# Patient Record
Sex: Female | Born: 1965 | Race: Black or African American | Hispanic: No | Marital: Single | Smoking: Current every day smoker
Health system: Southern US, Community
[De-identification: ages and names within clinical notes are randomized; demographics above are authoritative.]

## PROBLEM LIST (undated history)

## (undated) DIAGNOSIS — F329 Major depressive disorder, single episode, unspecified: Secondary | ICD-10-CM

## (undated) DIAGNOSIS — D219 Benign neoplasm of connective and other soft tissue, unspecified: Secondary | ICD-10-CM

## (undated) DIAGNOSIS — F32A Depression, unspecified: Secondary | ICD-10-CM

## (undated) DIAGNOSIS — I10 Essential (primary) hypertension: Secondary | ICD-10-CM

## (undated) HISTORY — DX: Essential (primary) hypertension: I10

## (undated) HISTORY — PX: ANKLE SURGERY: SHX546

## (undated) HISTORY — DX: Depression, unspecified: F32.A

## (undated) HISTORY — DX: Major depressive disorder, single episode, unspecified: F32.9

## (undated) HISTORY — PX: OTHER SURGICAL HISTORY: SHX169

## (undated) HISTORY — DX: Benign neoplasm of connective and other soft tissue, unspecified: D21.9

---

## 1998-01-10 ENCOUNTER — Emergency Department (HOSPITAL_COMMUNITY): Admission: EM | Admit: 1998-01-10 | Discharge: 1998-01-10 | Payer: Self-pay | Admitting: Emergency Medicine

## 1998-11-24 ENCOUNTER — Emergency Department (HOSPITAL_COMMUNITY): Admission: EM | Admit: 1998-11-24 | Discharge: 1998-11-24 | Payer: Self-pay | Admitting: Emergency Medicine

## 1998-11-24 ENCOUNTER — Encounter: Payer: Self-pay | Admitting: Emergency Medicine

## 1999-02-07 ENCOUNTER — Emergency Department (HOSPITAL_COMMUNITY): Admission: EM | Admit: 1999-02-07 | Discharge: 1999-02-07 | Payer: Self-pay | Admitting: Emergency Medicine

## 1999-02-07 ENCOUNTER — Encounter: Payer: Self-pay | Admitting: Emergency Medicine

## 1999-03-11 ENCOUNTER — Emergency Department (HOSPITAL_COMMUNITY): Admission: EM | Admit: 1999-03-11 | Discharge: 1999-03-11 | Payer: Self-pay | Admitting: Emergency Medicine

## 2001-02-07 ENCOUNTER — Emergency Department (HOSPITAL_COMMUNITY): Admission: EM | Admit: 2001-02-07 | Discharge: 2001-02-07 | Payer: Self-pay | Admitting: Emergency Medicine

## 2001-04-30 ENCOUNTER — Emergency Department (HOSPITAL_COMMUNITY): Admission: EM | Admit: 2001-04-30 | Discharge: 2001-04-30 | Payer: Self-pay | Admitting: Emergency Medicine

## 2002-10-12 ENCOUNTER — Emergency Department (HOSPITAL_COMMUNITY): Admission: EM | Admit: 2002-10-12 | Discharge: 2002-10-13 | Payer: Self-pay | Admitting: Emergency Medicine

## 2002-12-23 ENCOUNTER — Encounter: Payer: Self-pay | Admitting: Internal Medicine

## 2002-12-23 ENCOUNTER — Ambulatory Visit (HOSPITAL_COMMUNITY): Admission: RE | Admit: 2002-12-23 | Discharge: 2002-12-23 | Payer: Self-pay | Admitting: Internal Medicine

## 2003-03-23 ENCOUNTER — Ambulatory Visit (HOSPITAL_COMMUNITY): Admission: RE | Admit: 2003-03-23 | Discharge: 2003-03-23 | Payer: Self-pay | Admitting: Internal Medicine

## 2003-08-05 ENCOUNTER — Emergency Department (HOSPITAL_COMMUNITY): Admission: EM | Admit: 2003-08-05 | Discharge: 2003-08-05 | Payer: Self-pay | Admitting: Emergency Medicine

## 2003-11-27 ENCOUNTER — Emergency Department (HOSPITAL_COMMUNITY): Admission: EM | Admit: 2003-11-27 | Discharge: 2003-11-27 | Payer: Self-pay | Admitting: *Deleted

## 2004-01-09 ENCOUNTER — Emergency Department (HOSPITAL_COMMUNITY): Admission: EM | Admit: 2004-01-09 | Discharge: 2004-01-10 | Payer: Self-pay | Admitting: Emergency Medicine

## 2004-02-09 ENCOUNTER — Emergency Department (HOSPITAL_COMMUNITY): Admission: EM | Admit: 2004-02-09 | Discharge: 2004-02-09 | Payer: Self-pay | Admitting: *Deleted

## 2004-05-02 ENCOUNTER — Emergency Department (HOSPITAL_COMMUNITY): Admission: EM | Admit: 2004-05-02 | Discharge: 2004-05-02 | Payer: Self-pay | Admitting: Emergency Medicine

## 2004-05-13 ENCOUNTER — Emergency Department (HOSPITAL_COMMUNITY): Admission: EM | Admit: 2004-05-13 | Discharge: 2004-05-13 | Payer: Self-pay | Admitting: Family Medicine

## 2004-05-15 ENCOUNTER — Emergency Department (HOSPITAL_COMMUNITY): Admission: EM | Admit: 2004-05-15 | Discharge: 2004-05-15 | Payer: Self-pay | Admitting: Family Medicine

## 2004-05-17 ENCOUNTER — Emergency Department (HOSPITAL_COMMUNITY): Admission: EM | Admit: 2004-05-17 | Discharge: 2004-05-17 | Payer: Self-pay | Admitting: Family Medicine

## 2004-11-07 ENCOUNTER — Emergency Department (HOSPITAL_COMMUNITY): Admission: EM | Admit: 2004-11-07 | Discharge: 2004-11-07 | Payer: Self-pay | Admitting: Family Medicine

## 2005-03-10 ENCOUNTER — Emergency Department (HOSPITAL_COMMUNITY): Admission: EM | Admit: 2005-03-10 | Discharge: 2005-03-11 | Payer: Self-pay | Admitting: Emergency Medicine

## 2005-03-15 ENCOUNTER — Emergency Department (HOSPITAL_COMMUNITY): Admission: EM | Admit: 2005-03-15 | Discharge: 2005-03-15 | Payer: Self-pay | Admitting: Emergency Medicine

## 2006-02-21 ENCOUNTER — Emergency Department (HOSPITAL_COMMUNITY): Admission: EM | Admit: 2006-02-21 | Discharge: 2006-02-21 | Payer: Self-pay | Admitting: Family Medicine

## 2006-02-22 ENCOUNTER — Ambulatory Visit (HOSPITAL_COMMUNITY): Admission: RE | Admit: 2006-02-22 | Discharge: 2006-02-22 | Payer: Self-pay | Admitting: Emergency Medicine

## 2006-04-08 ENCOUNTER — Ambulatory Visit: Payer: Self-pay | Admitting: Family Medicine

## 2006-04-09 ENCOUNTER — Ambulatory Visit: Payer: Self-pay | Admitting: *Deleted

## 2006-08-29 ENCOUNTER — Emergency Department (HOSPITAL_COMMUNITY): Admission: EM | Admit: 2006-08-29 | Discharge: 2006-08-30 | Payer: Self-pay | Admitting: Emergency Medicine

## 2006-09-02 ENCOUNTER — Emergency Department (HOSPITAL_COMMUNITY): Admission: EM | Admit: 2006-09-02 | Discharge: 2006-09-02 | Payer: Self-pay | Admitting: Family Medicine

## 2006-10-07 ENCOUNTER — Ambulatory Visit: Payer: Self-pay | Admitting: Family Medicine

## 2006-11-01 ENCOUNTER — Ambulatory Visit: Payer: Self-pay | Admitting: Family Medicine

## 2007-03-26 ENCOUNTER — Encounter (INDEPENDENT_AMBULATORY_CARE_PROVIDER_SITE_OTHER): Payer: Self-pay | Admitting: *Deleted

## 2007-05-06 ENCOUNTER — Encounter (INDEPENDENT_AMBULATORY_CARE_PROVIDER_SITE_OTHER): Payer: Self-pay | Admitting: Family Medicine

## 2007-05-06 ENCOUNTER — Ambulatory Visit: Payer: Self-pay | Admitting: Family Medicine

## 2007-05-06 LAB — CONVERTED CEMR LAB: GC Probe Amp, Genital: NEGATIVE

## 2007-05-09 ENCOUNTER — Ambulatory Visit: Payer: Self-pay | Admitting: Internal Medicine

## 2007-06-01 ENCOUNTER — Emergency Department (HOSPITAL_COMMUNITY): Admission: EM | Admit: 2007-06-01 | Discharge: 2007-06-01 | Payer: Self-pay | Admitting: Emergency Medicine

## 2007-06-11 ENCOUNTER — Ambulatory Visit: Payer: Self-pay | Admitting: Family Medicine

## 2007-10-28 ENCOUNTER — Ambulatory Visit: Payer: Self-pay | Admitting: Internal Medicine

## 2007-12-10 ENCOUNTER — Emergency Department (HOSPITAL_COMMUNITY): Admission: EM | Admit: 2007-12-10 | Discharge: 2007-12-10 | Payer: Self-pay | Admitting: Family Medicine

## 2008-03-24 ENCOUNTER — Ambulatory Visit: Payer: Self-pay | Admitting: Family Medicine

## 2008-04-28 ENCOUNTER — Emergency Department (HOSPITAL_COMMUNITY): Admission: EM | Admit: 2008-04-28 | Discharge: 2008-04-28 | Payer: Self-pay | Admitting: Family Medicine

## 2008-05-04 ENCOUNTER — Ambulatory Visit: Payer: Self-pay | Admitting: Family Medicine

## 2008-09-02 ENCOUNTER — Ambulatory Visit: Payer: Self-pay | Admitting: Family Medicine

## 2008-09-06 ENCOUNTER — Ambulatory Visit (HOSPITAL_COMMUNITY): Admission: RE | Admit: 2008-09-06 | Discharge: 2008-09-06 | Payer: Self-pay | Admitting: Internal Medicine

## 2008-09-30 ENCOUNTER — Ambulatory Visit: Payer: Self-pay | Admitting: Family Medicine

## 2008-10-02 ENCOUNTER — Inpatient Hospital Stay (HOSPITAL_COMMUNITY): Admission: AD | Admit: 2008-10-02 | Discharge: 2008-10-02 | Payer: Self-pay | Admitting: Family Medicine

## 2008-10-03 ENCOUNTER — Inpatient Hospital Stay (HOSPITAL_COMMUNITY): Admission: AD | Admit: 2008-10-03 | Discharge: 2008-10-03 | Payer: Self-pay | Admitting: Obstetrics and Gynecology

## 2008-10-19 ENCOUNTER — Ambulatory Visit: Payer: Self-pay | Admitting: Family Medicine

## 2008-10-20 ENCOUNTER — Inpatient Hospital Stay (HOSPITAL_COMMUNITY): Admission: AD | Admit: 2008-10-20 | Discharge: 2008-10-20 | Payer: Self-pay | Admitting: Obstetrics & Gynecology

## 2008-10-27 ENCOUNTER — Ambulatory Visit: Payer: Self-pay | Admitting: Obstetrics & Gynecology

## 2008-11-01 ENCOUNTER — Emergency Department (HOSPITAL_COMMUNITY): Admission: EM | Admit: 2008-11-01 | Discharge: 2008-11-01 | Payer: Self-pay | Admitting: Emergency Medicine

## 2008-11-08 ENCOUNTER — Ambulatory Visit: Payer: Self-pay | Admitting: Obstetrics & Gynecology

## 2008-11-08 ENCOUNTER — Emergency Department (HOSPITAL_COMMUNITY): Admission: EM | Admit: 2008-11-08 | Discharge: 2008-11-08 | Payer: Self-pay | Admitting: Emergency Medicine

## 2008-12-30 ENCOUNTER — Ambulatory Visit: Payer: Self-pay | Admitting: Family Medicine

## 2009-01-24 ENCOUNTER — Ambulatory Visit: Payer: Self-pay | Admitting: Obstetrics & Gynecology

## 2009-02-16 ENCOUNTER — Ambulatory Visit: Payer: Self-pay | Admitting: Family Medicine

## 2009-02-16 LAB — CONVERTED CEMR LAB: Vit D, 25-Hydroxy: 18 ng/mL — ABNORMAL LOW (ref 30–89)

## 2009-03-08 ENCOUNTER — Emergency Department (HOSPITAL_COMMUNITY): Admission: EM | Admit: 2009-03-08 | Discharge: 2009-03-08 | Payer: Self-pay | Admitting: Emergency Medicine

## 2009-04-03 ENCOUNTER — Emergency Department (HOSPITAL_COMMUNITY): Admission: EM | Admit: 2009-04-03 | Discharge: 2009-04-04 | Payer: Self-pay | Admitting: Emergency Medicine

## 2009-04-04 ENCOUNTER — Ambulatory Visit: Payer: Self-pay | Admitting: Psychiatry

## 2009-04-14 ENCOUNTER — Ambulatory Visit: Payer: Self-pay | Admitting: Obstetrics and Gynecology

## 2009-05-12 ENCOUNTER — Telehealth (INDEPENDENT_AMBULATORY_CARE_PROVIDER_SITE_OTHER): Payer: Self-pay | Admitting: *Deleted

## 2009-06-17 ENCOUNTER — Telehealth (INDEPENDENT_AMBULATORY_CARE_PROVIDER_SITE_OTHER): Payer: Self-pay | Admitting: *Deleted

## 2009-06-30 ENCOUNTER — Ambulatory Visit: Payer: Self-pay | Admitting: Obstetrics & Gynecology

## 2009-08-04 ENCOUNTER — Ambulatory Visit: Payer: Self-pay | Admitting: Physician Assistant

## 2009-08-04 ENCOUNTER — Inpatient Hospital Stay (HOSPITAL_COMMUNITY): Admission: AD | Admit: 2009-08-04 | Discharge: 2009-08-04 | Payer: Self-pay | Admitting: Obstetrics & Gynecology

## 2009-08-05 ENCOUNTER — Ambulatory Visit: Payer: Self-pay | Admitting: Family Medicine

## 2009-08-12 ENCOUNTER — Ambulatory Visit: Payer: Self-pay | Admitting: Obstetrics & Gynecology

## 2009-08-12 LAB — CONVERTED CEMR LAB
Albumin: 3.5 g/dL (ref 3.5–5.2)
Alkaline Phosphatase: 68 units/L (ref 39–117)
BUN: 13 mg/dL (ref 6–23)
Barbiturate Quant, Ur: NEGATIVE
CO2: 25 meq/L (ref 19–32)
Chloride: 105 meq/L (ref 96–112)
Cocaine Metabolites: POSITIVE — AB
Creatinine, Ser: 0.67 mg/dL (ref 0.40–1.20)
Glucose, Bld: 88 mg/dL (ref 70–99)
Methadone: NEGATIVE
Potassium: 4.1 meq/L (ref 3.5–5.3)
Sodium: 142 meq/L (ref 135–145)
Total Bilirubin: 0.2 mg/dL — ABNORMAL LOW (ref 0.3–1.2)
Total Protein: 7.5 g/dL (ref 6.0–8.3)

## 2009-11-16 ENCOUNTER — Ambulatory Visit: Payer: Self-pay | Admitting: Obstetrics and Gynecology

## 2009-11-30 ENCOUNTER — Emergency Department (HOSPITAL_COMMUNITY): Admission: EM | Admit: 2009-11-30 | Discharge: 2009-11-30 | Payer: Self-pay | Admitting: Family Medicine

## 2009-12-01 ENCOUNTER — Encounter: Admission: RE | Admit: 2009-12-01 | Discharge: 2009-12-01 | Payer: Self-pay | Admitting: Internal Medicine

## 2009-12-20 ENCOUNTER — Encounter: Admission: RE | Admit: 2009-12-20 | Discharge: 2009-12-20 | Payer: Self-pay | Admitting: Internal Medicine

## 2010-01-30 ENCOUNTER — Ambulatory Visit: Payer: Self-pay | Admitting: Obstetrics and Gynecology

## 2010-01-30 LAB — CONVERTED CEMR LAB
HCT: 40.2 % (ref 36.0–46.0)
Hemoglobin: 13.3 g/dL (ref 12.0–15.0)
WBC: 7.3 10*3/uL (ref 4.0–10.5)

## 2010-01-31 ENCOUNTER — Encounter: Payer: Self-pay | Admitting: Obstetrics and Gynecology

## 2010-01-31 LAB — CONVERTED CEMR LAB
Chlamydia, DNA Probe: NEGATIVE
Trich, Wet Prep: NONE SEEN
WBC, Wet Prep HPF POC: NONE SEEN

## 2010-02-27 ENCOUNTER — Ambulatory Visit (HOSPITAL_COMMUNITY): Admission: RE | Admit: 2010-02-27 | Discharge: 2010-02-27 | Payer: Self-pay | Admitting: Orthopedic Surgery

## 2010-03-16 ENCOUNTER — Emergency Department (HOSPITAL_COMMUNITY): Admission: EM | Admit: 2010-03-16 | Discharge: 2010-03-16 | Payer: Self-pay | Admitting: Family Medicine

## 2010-03-22 ENCOUNTER — Ambulatory Visit (HOSPITAL_COMMUNITY): Admission: RE | Admit: 2010-03-22 | Discharge: 2010-03-23 | Payer: Self-pay | Admitting: Orthopedic Surgery

## 2010-03-29 ENCOUNTER — Encounter (INDEPENDENT_AMBULATORY_CARE_PROVIDER_SITE_OTHER): Payer: Self-pay | Admitting: Emergency Medicine

## 2010-03-29 ENCOUNTER — Inpatient Hospital Stay (HOSPITAL_COMMUNITY): Admission: EM | Admit: 2010-03-29 | Discharge: 2010-03-31 | Payer: Self-pay | Admitting: Emergency Medicine

## 2010-03-29 ENCOUNTER — Ambulatory Visit: Payer: Self-pay | Admitting: Cardiology

## 2010-03-31 ENCOUNTER — Encounter (INDEPENDENT_AMBULATORY_CARE_PROVIDER_SITE_OTHER): Payer: Self-pay | Admitting: Internal Medicine

## 2010-04-10 ENCOUNTER — Telehealth (INDEPENDENT_AMBULATORY_CARE_PROVIDER_SITE_OTHER): Payer: Self-pay | Admitting: *Deleted

## 2010-04-11 ENCOUNTER — Ambulatory Visit: Payer: Self-pay | Admitting: Cardiology

## 2010-04-11 ENCOUNTER — Encounter: Payer: Self-pay | Admitting: Cardiology

## 2010-04-11 ENCOUNTER — Encounter (INDEPENDENT_AMBULATORY_CARE_PROVIDER_SITE_OTHER): Payer: Self-pay | Admitting: *Deleted

## 2010-04-11 ENCOUNTER — Encounter: Payer: Self-pay | Admitting: Cardiovascular Disease

## 2010-04-11 ENCOUNTER — Encounter (HOSPITAL_COMMUNITY)
Admission: RE | Admit: 2010-04-11 | Discharge: 2010-04-24 | Payer: Self-pay | Source: Home / Self Care | Admitting: Cardiology

## 2010-04-11 ENCOUNTER — Ambulatory Visit: Payer: Self-pay

## 2010-04-17 ENCOUNTER — Ambulatory Visit: Payer: Self-pay | Admitting: Family Medicine

## 2010-04-20 ENCOUNTER — Emergency Department (HOSPITAL_COMMUNITY): Admission: EM | Admit: 2010-04-20 | Discharge: 2010-04-20 | Payer: Self-pay | Admitting: Family Medicine

## 2010-07-04 ENCOUNTER — Ambulatory Visit: Payer: Self-pay | Admitting: Family Medicine

## 2010-07-17 ENCOUNTER — Encounter (INDEPENDENT_AMBULATORY_CARE_PROVIDER_SITE_OTHER): Payer: Self-pay | Admitting: *Deleted

## 2010-07-17 ENCOUNTER — Ambulatory Visit
Admission: RE | Admit: 2010-07-17 | Discharge: 2010-07-17 | Payer: Self-pay | Source: Home / Self Care | Attending: Obstetrics and Gynecology | Admitting: Obstetrics and Gynecology

## 2010-07-17 LAB — CONVERTED CEMR LAB
Chlamydia, DNA Probe: NEGATIVE
GC Probe Amp, Genital: NEGATIVE

## 2010-07-18 ENCOUNTER — Encounter (INDEPENDENT_AMBULATORY_CARE_PROVIDER_SITE_OTHER): Payer: Self-pay | Admitting: *Deleted

## 2010-07-18 LAB — CONVERTED CEMR LAB
Trich, Wet Prep: NONE SEEN
WBC, Wet Prep HPF POC: NONE SEEN
Yeast Wet Prep HPF POC: NONE SEEN

## 2010-07-30 ENCOUNTER — Encounter: Payer: Self-pay | Admitting: Internal Medicine

## 2010-07-30 ENCOUNTER — Encounter: Payer: Self-pay | Admitting: Orthopedic Surgery

## 2010-08-02 ENCOUNTER — Ambulatory Visit (HOSPITAL_COMMUNITY)
Admission: RE | Admit: 2010-08-02 | Discharge: 2010-08-02 | Payer: Self-pay | Source: Home / Self Care | Attending: Obstetrics and Gynecology | Admitting: Obstetrics and Gynecology

## 2010-08-07 ENCOUNTER — Ambulatory Visit
Admission: RE | Admit: 2010-08-07 | Discharge: 2010-08-07 | Payer: Self-pay | Source: Home / Self Care | Attending: Obstetrics and Gynecology | Admitting: Obstetrics and Gynecology

## 2010-08-08 NOTE — Progress Notes (Signed)
NAME:  Kristin Bond, Kristin Bond NO.:  [id number]  MEDICAL RECORD NO.:  [id number]          PATIENT TYPE:  WOC  LOCATION:  [facility]                   FACILITY:  [facility]  PHYSICIAN:  Elsie Lincoln, MD      DATE OF BIRTH:  August 09, 1965  DATE OF SERVICE:  08/07/2010                                 CLINIC NOTE  The patient is a 45 year old female who presents for followup of cramping and bleeding.  The patient had a transvaginal ultrasound and saline sonohysterogram, which showed a fibroid again, but it does not invade the endometrial cavity.  The fibroid measured  4.2 x 3.9 x 3.6, which is slightly increased from the last ultrasound.  This is probably secondary to her not being under the influence of Depo-Lupron.  She also has a small her right lower uterine segment fibroid measuring 1.5 x 1.4 x 1.1.  Again, she does not have any submucosal component based on the saline sonohysterogram.  The patient does have a very thin lining less than 4 mm and this is probably due to Depo-Provera.  She has some irregular bleeding, but this has now seem to have improved.  She has not bled since the beginning of January.  There are multiple notes about the patient having all kinds of pelvic pain, pain in her anus, pain in her vulva, pain in her bladder, pain in her levators, pain in her abdomen. We have pretty much determined that a hysterectomy would not relieve the patient of her pain.  She again stated today that Depo-Lupron did not take her out of her pain, so I do not think this is endometriosis. There is some thoughts that she is drug seeking.  She has received Percocet from other doctors, and she told me that the pharmacist was checking around to see if she is getting prescriptions from other places.  I am going to ask, Branner, our nurse here in clinic to do some reconnaissance and see if she is getting multiple prescriptions for narcotics from multiple doctors.  The patient does have a  fibroid that is medium-sized, so this fibroid could be causing her pain.  However, I do not think she needs a tremendous amount of pain medication to control this, may be just some during her cycle.  I do not think she is a good surgical candidate.  I do think hysterectomy will help her with her multiple complaints.  I did notice today that the patient has not had a Pap smears since 2008.  I asked if we could do a Pap smear today and she refused.  I did get her to agree that we will do a Pap smear in April when she is due for her next Depo-Provera.  The patient is up-to-date on mammogram.  It should be noted today that her blood pressure was 147/102.  She states that she takes hydrochlorothiazide and we will contact her medical doctor about increasing her dosage for change in her medication.  So as we are leaving today, the patient is getting a prescription for Percocet and going to get Depo-Provera in April.  She is overall satisfied with her bleeding profile  and has slight pain during her "monthly."  She needs a Pap smear desperately and she has her blood pressure addressed.  The main thing that needs to be done is reconnaissance to see if she is getting narcotics from multiple doctors. I will ask Branner to see this.          ______________________________ Elsie Lincoln, MD    KL/MEDQ  D:  08/07/2010  T:  08/08/2010  Job:  [id number]

## 2010-08-10 NOTE — Progress Notes (Signed)
Summary: nuc pre procedure  Phone Note Outgoing Call Call back at Home Phone [phone]   Call placed by: Cathlyn Parsons RN,  April 10, 2010 2:40 PM Call placed to: Patient Reason for Call: Confirm/change Appt Summary of Call: Reviewed information on [employer] Information Sheet (see scanned document for further details).  Spoke with patient.      Nuclear Med Background Indications for Stress Test: Evaluation for Ischemia, Post Hospital  Indications Comments: 03/29/10 [facility] with CP and SOB;initial Troponins elevated then normalized.  History: Asthma, Echo  History Comments: 03/31/10 Echo-EF nl  Symptoms: Chest Pain, SOB    Nuclear Pre-Procedure Cardiac Risk Factors: Hypertension, Smoker

## 2010-08-10 NOTE — Letter (Signed)
Summary: Outpatient Coinsurance Notice  Outpatient Coinsurance Notice   Imported By: Marylou Mccoy 04/19/2010 12:01:19  _____________________________________________________________________  External Attachment:    Type:   Image     Comment:   External Document

## 2010-08-10 NOTE — Letter (Signed)
Summary: Outpatient Coinsurance Notice  Outpatient Coinsurance Notice   Imported By: Marylou Mccoy 04/19/2010 12:01:59  _____________________________________________________________________  External Attachment:    Type:   Image     Comment:   External Document

## 2010-08-10 NOTE — Assessment & Plan Note (Signed)
Summary: Cardiology Nuclear Testing  Nuclear Med Background Indications for Stress Test: Evaluation for Ischemia, Post Hospital  Indications Comments: 03/29/10 [facility] with CP and SOB; initial Troponins elevated then normalized.  History: Asthma, Echo  History Comments: 03/31/10 Echo:normal  Symptoms: Chest Pain, Diaphoresis, Dizziness, DOE, Fatigue, Rapid HR, SOB  Symptoms Comments: Last episode of JX:BJYN since d/c   Nuclear Pre-Procedure Cardiac Risk Factors: Hypertension, Obesity, Smoker Caffeine/Decaff Intake: None NPO After: 7:00 AM Lungs: Coarse, but clear.  O2 Sat 98% on RA. IV 0.9% NS with Angio Cath: 24g     IV Site: L Hand IV Started by: Irean Hong, RN Chest Size (in) 36     Cup Size D     Height (in): 61.5 Weight (lb): 180 BMI: 33.58  Nuclear Med Study 1 or 2 day study:  1 day     Stress Test Type:  Eugenie Birks Reading MD:  Marca Ancona, MD     Referring MD:  Willa Rough, MD Resting Radionuclide:  Technetium 19m Tetrofosmin     Resting Radionuclide Dose:  10.5 mCi  Stress Radionuclide:  Technetium 28m Tetrofosmin     Stress Radionuclide Dose:  33 mCi   Stress Protocol   Lexiscan: 0.4 mg   Stress Test Technologist:  [employer]a [employer], CMA-N     Nuclear Technologist:  Domenic Polite, CNMT  Rest Procedure  Myocardial perfusion imaging was performed at rest 45 minutes following the intravenous administration of Technetium 57m Tetrofosmin.  Stress Procedure  The patient received IV Lexiscan 0.4 mg over 15-seconds.  Technetium 83m Tetrofosmin injected at 30-seconds.  There were no significant changes with infusion.  Quantitative spect images were obtained after a 45 minute delay.  QPS Raw Data Images:  Normal; no motion artifact; normal heart/lung ratio. Stress Images:  Normal homogeneous uptake in all areas of the myocardium. Rest Images:  Normal homogeneous uptake in all areas of the myocardium. Subtraction (SDS):  Normal Transient Ischemic Dilatation:  0.94   (Normal <1.22)  Lung/Heart Ratio:  0.29  (Normal <0.45)  Quantitative Gated Spect Images QGS EDV:  57 ml QGS ESV:  21 ml QGS EF:  64 % QGS cine images:  Normal wall motion  Findings Normal nuclear study      Overall Impression  Exercise Capacity: Lexiscan with no exercise. BP Response: Normal blood pressure response. Clinical Symptoms: No chest pain ECG Impression: No significant ST segment change suggestive of ischemia. Overall Impression: Normal stress nuclear study.

## 2010-08-11 NOTE — Progress Notes (Signed)
NAME:  Kristin Bond, Kristin Bond NO.:  [id number]  MEDICAL RECORD NO.:  [id number]          PATIENT TYPE:  WOC  LOCATION:  [facility]                   FACILITY:  [facility]  PHYSICIAN:  Elsie Lincoln, MD      DATE OF BIRTH:  1966/06/21  DATE OF SERVICE:  08/10/2010                                 CLINIC NOTE  The patient was given a prescription for Percocet on August 07, 2010. The patient went to the pharmacy to get it filled and the pharmacy called and stated that the patient already had an active Percocet prescription from Dr. Lajoyce Corners.  She had been getting them for the past 3 months.  The patient did not tell me this when she was given the Percocet prescription.  The patient has gotten the Percocet prescription approximately around January 9 and it was 60 tablets.  I do think the patient is drug seeking.  I do not know whether she is selling or taking herself.  The patient has some reason for pain with the fibroids; however, she does have a lot of other pelvic issues for pain and her varying answers with satisfaction with pain is also of some concern. She also requested Perc 5, which also raises the thought that she might be drug seeking.  I just wanted this in the clinic notes so the next provider can be aware that she is getting narcotics from more than one provider, and I will be investigating whether she is getting even more narcotics.          ______________________________ Elsie Lincoln, MD    KL/MEDQ  D:  08/10/2010  T:  08/11/2010  Job:  [id number]

## 2010-09-21 LAB — PROTIME-INR
INR: 0.92 (ref 0.00–1.49)
Prothrombin Time: 20.6 seconds — ABNORMAL HIGH (ref 11.6–15.2)
Prothrombin Time: 12.3 seconds (ref 11.6–15.2)
Prothrombin Time: 12.6 seconds (ref 11.6–15.2)

## 2010-09-21 LAB — BASIC METABOLIC PANEL
CO2: 15 mEq/L — ABNORMAL LOW (ref 19–32)
Calcium: 8.5 mg/dL (ref 8.4–10.5)
Calcium: 9.3 mg/dL (ref 8.4–10.5)
Chloride: 108 mEq/L (ref 96–112)
Creatinine, Ser: 0.92 mg/dL (ref 0.4–1.2)
GFR calc Af Amer: 36 mL/min — ABNORMAL LOW (ref >60)
GFR calc Af Amer: >60 mL/min (ref >60)
GFR calc non Af Amer: 41 mL/min — ABNORMAL LOW (ref >60)
GFR calc non Af Amer: >60 mL/min (ref >60)
Glucose, Bld: 103 mg/dL — ABNORMAL HIGH (ref 70–99)
Glucose, Bld: 84 mg/dL (ref 70–99)
Potassium: 3.5 mEq/L (ref 3.5–5.1)
Potassium: 4.2 mEq/L (ref 3.5–5.1)
Sodium: 138 mEq/L (ref 135–145)

## 2010-09-21 LAB — COMPREHENSIVE METABOLIC PANEL
ALT: 35 U/L (ref 0–35)
AST: 27 U/L (ref 0–37)
Albumin: 3.4 g/dL — ABNORMAL LOW (ref 3.5–5.2)
Alkaline Phosphatase: 57 U/L (ref 39–117)
Alkaline Phosphatase: 54 U/L (ref 39–117)
BUN: 12 mg/dL (ref 6–23)
CO2: 22 mEq/L (ref 19–32)
Chloride: 111 mEq/L (ref 96–112)
Creatinine, Ser: 0.84 mg/dL (ref 0.4–1.2)
GFR calc Af Amer: >60 mL/min (ref >60)
GFR calc non Af Amer: >60 mL/min (ref >60)
GFR calc non Af Amer: >60 mL/min (ref >60)
Glucose, Bld: 101 mg/dL — ABNORMAL HIGH (ref 70–99)
Glucose, Bld: 84 mg/dL (ref 70–99)
Potassium: 3.3 mEq/L — ABNORMAL LOW (ref 3.5–5.1)
Potassium: 3.1 mEq/L — ABNORMAL LOW (ref 3.5–5.1)
Sodium: 138 mEq/L (ref 135–145)
Total Bilirubin: 0.2 mg/dL — ABNORMAL LOW (ref 0.3–1.2)

## 2010-09-21 LAB — CBC
HCT: 43.5 % (ref 36.0–46.0)
HCT: 40.7 % (ref 36.0–46.0)
HCT: 37.7 % (ref 36.0–46.0)
Hemoglobin: 11.3 g/dL — ABNORMAL LOW (ref 12.0–15.0)
Hemoglobin: 15 g/dL (ref 12.0–15.0)
Hemoglobin: 14 g/dL (ref 12.0–15.0)
MCH: 30.4 pg (ref 26.0–34.0)
MCHC: 33 g/dL (ref 30.0–36.0)
MCHC: 34.5 g/dL (ref 30.0–36.0)
MCV: 90.2 fL (ref 78.0–100.0)
Platelets: 171 10*3/uL (ref 150–400)
Platelets: 191 10*3/uL (ref 150–400)
Platelets: 208 10*3/uL (ref 150–400)
RBC: 3.62 MIL/uL — ABNORMAL LOW (ref 3.87–5.11)
RBC: 4.84 MIL/uL (ref 3.87–5.11)
RBC: 4.55 MIL/uL (ref 3.87–5.11)
RBC: 4.18 MIL/uL (ref 3.87–5.11)
RDW: 14.8 % (ref 11.5–15.5)
RDW: 15 % (ref 11.5–15.5)
RDW: 15 % (ref 11.5–15.5)
WBC: 5.1 10*3/uL (ref 4.0–10.5)
WBC: 7.9 10*3/uL (ref 4.0–10.5)
WBC: 8.3 10*3/uL (ref 4.0–10.5)
WBC: 7.4 10*3/uL (ref 4.0–10.5)

## 2010-09-21 LAB — CARDIAC PANEL(CRET KIN+CKTOT+MB+TROPI)
CK, MB: 2 ng/mL (ref 0.3–4.0)
CK, MB: 2.3 ng/mL (ref 0.3–4.0)
Relative Index: INVALID (ref 0.0–2.5)
Total CK: 73 U/L (ref 7–177)

## 2010-09-21 LAB — DIFFERENTIAL
Basophils Absolute: 0 10*3/uL (ref 0.0–0.1)
Lymphs Abs: 0.8 10*3/uL (ref 0.7–4.0)
Monocytes Absolute: 0.7 10*3/uL (ref 0.1–1.0)
Monocytes Relative: 9 % (ref 3–12)
Neutrophils Relative %: 80 % — ABNORMAL HIGH (ref 43–77)

## 2010-09-21 LAB — POCT CARDIAC MARKERS
Myoglobin, poc: 150 ng/mL (ref 12–200)
Troponin i, poc: <0.05 ng/mL (ref 0.00–0.09)

## 2010-09-21 LAB — APTT
aPTT: 38 seconds — ABNORMAL HIGH (ref 24–37)
aPTT: 25 seconds (ref 24–37)

## 2010-09-21 LAB — CK TOTAL AND CKMB (NOT AT ARMC): Relative Index: 1.7 (ref 0.0–2.5)

## 2010-09-21 LAB — T4, FREE: Free T4: 1.27 ng/dL (ref 0.80–1.80)

## 2010-09-21 LAB — TROPONIN I: Troponin I: 0.09 ng/mL — ABNORMAL HIGH (ref 0.00–0.06)

## 2010-09-25 LAB — WET PREP, GENITAL

## 2010-09-25 LAB — GC/CHLAMYDIA PROBE AMP, GENITAL
Chlamydia, DNA Probe: NEGATIVE
GC Probe Amp, Genital: NEGATIVE

## 2010-09-25 LAB — POCT URINALYSIS DIP (DEVICE)
Bilirubin Urine: NEGATIVE
Glucose, UA: NEGATIVE mg/dL
Hgb urine dipstick: NEGATIVE
Nitrite: NEGATIVE
Urobilinogen, UA: 1 mg/dL (ref 0.0–1.0)

## 2010-09-25 LAB — POCT PREGNANCY, URINE: Preg Test, Ur: NEGATIVE

## 2010-09-27 LAB — POCT URINALYSIS DIP (DEVICE)
Bilirubin Urine: NEGATIVE
Glucose, UA: NEGATIVE mg/dL
Hgb urine dipstick: NEGATIVE
Nitrite: NEGATIVE
Urobilinogen, UA: 1 mg/dL (ref 0.0–1.0)

## 2010-10-03 ENCOUNTER — Ambulatory Visit: Payer: Self-pay

## 2010-10-13 LAB — DIFFERENTIAL
Basophils Absolute: 0 10*3/uL (ref 0.0–0.1)
Lymphocytes Relative: 56 % — ABNORMAL HIGH (ref 12–46)
Lymphs Abs: 5.3 10*3/uL — ABNORMAL HIGH (ref 0.7–4.0)
Monocytes Absolute: 0.8 10*3/uL (ref 0.1–1.0)
Monocytes Relative: 8 % (ref 3–12)
Neutro Abs: 3.3 10*3/uL (ref 1.7–7.7)

## 2010-10-13 LAB — RAPID URINE DRUG SCREEN, HOSP PERFORMED
Opiates: NOT DETECTED
Tetrahydrocannabinol: NOT DETECTED

## 2010-10-13 LAB — COMPREHENSIVE METABOLIC PANEL
AST: 71 U/L — ABNORMAL HIGH (ref 0–37)
Albumin: 3.9 g/dL (ref 3.5–5.2)
BUN: 16 mg/dL (ref 6–23)
Calcium: 8.8 mg/dL (ref 8.4–10.5)
Creatinine, Ser: 0.82 mg/dL (ref 0.4–1.2)
GFR calc Af Amer: >60 mL/min (ref >60)
Total Bilirubin: 0.6 mg/dL (ref 0.3–1.2)
Total Protein: 8.6 g/dL — ABNORMAL HIGH (ref 6.0–8.3)

## 2010-10-13 LAB — CBC
HCT: 42.4 % (ref 36.0–46.0)
Hemoglobin: 14.2 g/dL (ref 12.0–15.0)
MCHC: 33.5 g/dL (ref 30.0–36.0)
RBC: 4.71 MIL/uL (ref 3.87–5.11)

## 2010-10-18 LAB — CULTURE, ROUTINE-ABSCESS

## 2010-10-19 LAB — URINALYSIS, ROUTINE W REFLEX MICROSCOPIC
Glucose, UA: NEGATIVE mg/dL
Ketones, ur: NEGATIVE mg/dL
Protein, ur: NEGATIVE mg/dL
Urobilinogen, UA: 0.2 mg/dL (ref 0.0–1.0)

## 2010-10-19 LAB — CBC
HCT: 39.9 % (ref 36.0–46.0)
Platelets: 192 10*3/uL (ref 150–400)
WBC: 6.9 10*3/uL (ref 4.0–10.5)

## 2010-10-19 LAB — WET PREP, GENITAL: Clue Cells Wet Prep HPF POC: NONE SEEN

## 2010-10-19 LAB — SEDIMENTATION RATE: Sed Rate: 13 mm/hr (ref 0–22)

## 2010-10-19 LAB — GC/CHLAMYDIA PROBE AMP, GENITAL: Chlamydia, DNA Probe: NEGATIVE

## 2010-10-24 ENCOUNTER — Emergency Department (HOSPITAL_COMMUNITY)
Admission: EM | Admit: 2010-10-24 | Discharge: 2010-10-24 | Disposition: A | Discharge status: Home / Self Care | Payer: Medicare Other | Attending: Emergency Medicine | Admitting: Emergency Medicine

## 2010-10-24 DIAGNOSIS — I10 Essential (primary) hypertension: Secondary | ICD-10-CM | POA: Insufficient documentation

## 2010-10-24 DIAGNOSIS — K089 Disorder of teeth and supporting structures, unspecified: Secondary | ICD-10-CM | POA: Insufficient documentation

## 2010-10-24 DIAGNOSIS — Z8639 Personal history of other endocrine, nutritional and metabolic disease: Secondary | ICD-10-CM | POA: Insufficient documentation

## 2010-10-24 DIAGNOSIS — R51 Headache: Secondary | ICD-10-CM | POA: Insufficient documentation

## 2010-10-24 DIAGNOSIS — J45909 Unspecified asthma, uncomplicated: Secondary | ICD-10-CM | POA: Insufficient documentation

## 2010-10-24 DIAGNOSIS — G8918 Other acute postprocedural pain: Secondary | ICD-10-CM | POA: Insufficient documentation

## 2010-10-24 DIAGNOSIS — Z862 Personal history of diseases of the blood and blood-forming organs and certain disorders involving the immune mechanism: Secondary | ICD-10-CM | POA: Insufficient documentation

## 2010-10-26 ENCOUNTER — Ambulatory Visit: Payer: Self-pay | Admitting: Physician Assistant

## 2010-11-21 NOTE — Assessment & Plan Note (Signed)
NAME:  Kristin Bond, ROSMAN NO.:  [id number]   MEDICAL RECORD NO.:  [id number]          PATIENT TYPE:  WOC   LOCATION:  [facility]         FACILITY:  [facility]   PHYSICIAN:  Johnella Moloney, MD        DATE OF BIRTH:  03/15/1966   DATE OF SERVICE:                                  CLINIC NOTE   CHIEF COMPLAINT:  Pelvic pain, uterine fibroids.   HISTORY OF PRESENT ILLNESS:  The patient is a 45 year old para 0 who was  seen in MAU in March for pelvic pain.  The patient had an ultrasound  September 06, 2008 which was remarkable for a 7.5 cm uterus which contained  a 3 cm anterior myometrial fibroid at the fundus.  Her endometrial  stripe was noted to be about 5 mm in width and normal adnexa.  Of note,  the patient was seen in the emergency room prior to her MAU visit and  was given narcotic pain medications.  She was requesting further  narcotic medication at her visit October 03, 2008 for worsening pain.  The  patient was given more medication and told to come here in the GYN  Clinic for further evaluation.  Today, the patient says that she  continues to have the pain which comes and goes, but she denies having  any abnormal bleeding or any other concerns.  Of note in talking with  the patient, she is demonstrating flight of ideas and is unable to  really describe what her pain is and what the pattern of the pain is as  she seems to be fixated on getting more medications.   PAST MEDICAL HISTORY:  1. Bipolar disorder.  2. Asthma.  3. Hypertension.  4. Sinusitis.  5. Claustrophobia.   PAST SURGICAL HISTORY:  None.   PAST OB/GYN HISTORY:  The patient denies any pregnancies.  She does have  a history of abnormal Pap smears, but does not know what abnormality she  has.  The only Pap smear in the system is one that she had in October  2008 which was negative for intraepithelial lesions or malignancy and  had Trichomonas vaginalis present at that time for which she was  treated.  She does not remember when her last Pap smear was exactly, but  says that she does have an appointment for a colposcopy.  The patient  does have a history of Trichomonas, bacterial vaginosis, but denies any  other sexually transmitted diseases.   MEDICATIONS:  1. Seroquel.  2. Tylenol.  3. Amitriptyline.  4. Hydrochlorothiazide.  5. Proventil.  6. Symbicort.   ALLERGIES:  NO KNOWN DRUG ALLERGIES.   SOCIAL HISTORY:  The patient has a history of heavy drinking, cannabis  abuse and cigarette smoking.  Unable to quantify amounts of each that  she is doing.  She lives alone.  Denies any abuse history.   FAMILY HISTORY:  Unknown, but the patient denies any cancer history in  her family.   PHYSICAL EXAMINATION:  VITAL SIGNS:  Temperature 97.4, pulse 71, blood  pressure 142/95, weight 169 pounds, height 62-1/2 inches.  GENERAL:  No apparent distress.  ABDOMEN:  Soft, nontender, nondistended.  GU:  On pelvic examination, normal external female genitalia.  On  bimanual exam, the patient was diffusely tender.  When she was told to  say what area hurts her the most, she said posteriorly.  Of note, the  patient does have anterior fundal fibroids, so this is not consistent  with the pathology.  She does have a small mobile uterus, normal adnexa  palpated bilaterally.   ASSESSMENT/PLAN:  The patient is a 45 year old female who is here for  evaluation of fibroid and pelvic pain.  The patient is interested in  getting pain medication for her pain.  She was told that fibroids can  cause more of an inflammatory pain and so an anti-inflammatory might be  better for it.  She was given a prescription for diclofenac DR 75 mg  p.o. b.i.d.  However, the patient insisted that she wanted narcotic pain  medications which she was told that narcotics were not automatically  given in this clinic.  The patient was also counseled regarding other  options for management including using Lupron to see  if this would  decrease the size of her fibroid and ameliorate her symptoms.  However,  the side effects of hot flashes, night sweats, mood swings and changes  were discussed with the patient.  She will have to have monitoring given  her mental health history.  The other option that was discussed was  surgery and the patient does not want any surgery at this point.  She  will fill out the application for the Lupron.  She was told to discuss  this with her mental health Jerrit Horen to see if this could be a viable  option for her given her mental history.  If she is approved for the  Lupron, we will get this and we will see if this will ameliorate her  symptoms.  If she does get Lupron, she will be a candidate for probably  __________ therapy to kind of ameliorate the side effects of Lupron.  The patient will return to the clinic for her Lupron injection or for  further evaluation, for further management and discussion at a later  date.  Of note, the patient was given written information from Care  Notes about uterine fibroids and also about the Lupron injection, its  indications and her risk factors.           ______________________________  Johnella Moloney, MD     UD/MEDQ  D:  10/27/2008  T:  10/27/2008  Job:  [id number]

## 2011-01-06 DIAGNOSIS — D219 Benign neoplasm of connective and other soft tissue, unspecified: Secondary | ICD-10-CM

## 2011-01-06 DIAGNOSIS — F319 Bipolar disorder, unspecified: Secondary | ICD-10-CM

## 2011-01-06 DIAGNOSIS — R102 Pelvic and perineal pain: Secondary | ICD-10-CM

## 2011-01-17 ENCOUNTER — Ambulatory Visit: Payer: Medicare Other | Admitting: Obstetrics and Gynecology

## 2011-03-29 ENCOUNTER — Other Ambulatory Visit: Payer: Self-pay | Admitting: Internal Medicine

## 2011-03-29 DIAGNOSIS — Z1231 Encounter for screening mammogram for malignant neoplasm of breast: Secondary | ICD-10-CM

## 2011-04-09 ENCOUNTER — Ambulatory Visit: Payer: Medicare Other

## 2011-04-12 ENCOUNTER — Ambulatory Visit: Payer: Medicare Other

## 2011-04-17 LAB — URINALYSIS, ROUTINE W REFLEX MICROSCOPIC
Protein, ur: NEGATIVE
Urobilinogen, UA: 1

## 2011-04-17 LAB — WET PREP, GENITAL
WBC, Wet Prep HPF POC: NONE SEEN
Yeast Wet Prep HPF POC: NONE SEEN

## 2011-04-17 LAB — RPR: RPR Ser Ql: NONREACTIVE

## 2011-04-17 LAB — URINE MICROSCOPIC-ADD ON

## 2011-04-27 ENCOUNTER — Ambulatory Visit: Payer: Medicare Other

## 2011-05-02 ENCOUNTER — Encounter: Payer: Self-pay | Admitting: Advanced Practice Midwife

## 2011-05-02 ENCOUNTER — Other Ambulatory Visit (HOSPITAL_COMMUNITY)
Admission: RE | Admit: 2011-05-02 | Discharge: 2011-05-02 | Disposition: A | Discharge status: Home / Self Care | Payer: Medicare Other | Source: Ambulatory Visit | Attending: Advanced Practice Midwife | Admitting: Advanced Practice Midwife

## 2011-05-02 ENCOUNTER — Ambulatory Visit (INDEPENDENT_AMBULATORY_CARE_PROVIDER_SITE_OTHER): Payer: Medicare Other | Admitting: Advanced Practice Midwife

## 2011-05-02 DIAGNOSIS — Z124 Encounter for screening for malignant neoplasm of cervix: Secondary | ICD-10-CM | POA: Insufficient documentation

## 2011-05-02 DIAGNOSIS — I10 Essential (primary) hypertension: Secondary | ICD-10-CM | POA: Insufficient documentation

## 2011-05-02 DIAGNOSIS — Z01419 Encounter for gynecological examination (general) (routine) without abnormal findings: Secondary | ICD-10-CM

## 2011-05-02 DIAGNOSIS — D219 Benign neoplasm of connective and other soft tissue, unspecified: Secondary | ICD-10-CM

## 2011-05-02 DIAGNOSIS — D259 Leiomyoma of uterus, unspecified: Secondary | ICD-10-CM

## 2011-05-02 DIAGNOSIS — F1920 Other psychoactive substance dependence, uncomplicated: Secondary | ICD-10-CM

## 2011-05-02 DIAGNOSIS — Z23 Encounter for immunization: Secondary | ICD-10-CM

## 2011-05-02 DIAGNOSIS — F1120 Opioid dependence, uncomplicated: Secondary | ICD-10-CM | POA: Insufficient documentation

## 2011-05-02 MED ORDER — INFLUENZA VIRUS VACC SPLIT PF IM SUSP
0.5000 mL | Freq: Once | INTRAMUSCULAR | Status: AC
  Administered 2011-05-02: 0.5 mL via INTRAMUSCULAR

## 2011-05-02 MED ORDER — LEUPROLIDE ACETATE (3 MONTH) 11.25 MG IM KIT
11.2500 mg | PACK | Freq: Once | INTRAMUSCULAR | Status: AC
  Administered 2011-05-02: 11.25 mg via INTRAMUSCULAR

## 2011-05-02 NOTE — Progress Notes (Signed)
  Subjective:     Noor A Vides is a 45 y.o. woman who comes in today for a  pap smear only. Her most recent annual exam was on April 2012. Her most recent Pap smear was on 2008   and showed no abnormalities. Previous abnormal Pap smears: no. Contraception: Lupron Wants flu shot The following portions of the patient's history were reviewed and updated as appropriate: allergies, current medications, past family history, past medical history, past social history, past surgical history and problem list. States started having "monthlies" after Lupron wore off. C/O severe cramps unrelieved by Tylenol. Wants more Percocet today.  Also c/o hemorrhoid discomfort, but just got Rx for it.   Review of Systems Pertinent items are noted in HPI.   Objective:    BP 159/104  Pulse 84  Temp(Src) 97 F (36.1 C) (Oral)  Ht 5\' 1"  (1.549 m)  Wt 175 lb 12.8 oz (79.742 kg)  BMI 33.22 kg/m2  LMP 04/02/2011 Pelvic Exam: cervix normal in appearance, external genitalia normal and vagina normal without discharge. Pap smear obtained.  Pelvic exam difficult due to atrophy and discomfort.   Assessment:    Screening pap smear.   Plan:  Will give Lupron today.  Flu shot today. Pap sent.  Noted pt got Rx Percocet filled 04/11/11. Will not prescribe today.  Pt states her orthopedic doctor is the one who prescribed these.  I told her any further Rxs needed to come from him, so that only one provider is writing those Rxs. Follow BP with primary doctor.   Follow up in 3 months, or as indicated by Pap results.

## 2011-05-02 NOTE — Patient Instructions (Addendum)
Preventative Care for Adults, Female A healthy lifestyle and preventative care can promote health and wellness. Preventative health guidelines for women include the following key practices:  A routine yearly physical is a good way to check with your caregiver about your health and preventative screening. It is a chance to share any concerns and updates on your health, and to receive a thorough exam.   Visit your dentist for a routine exam and preventative care every 6 months. Brush your teeth twice a day and floss once a day. Good oral hygiene prevents tooth decay and gum disease.   The frequency of eye exams is based on your age, health, family medical history, use of contact lenses, and other factors. Follow your caregiver's recommendations for frequency of eye exams.   Eat a healthy diet. Foods like vegetables, fruits, whole grains, low-fat dairy products, and lean protein foods contain the nutrients you need without too many calories. Decrease your intake of foods high in solid fats, added sugars, and salt. Eat the right amount of calories for you.Get information about a proper diet from your caregiver, if necessary.   Regular physical exercise is one of the most important things you can do for your health. Most adults should get at least 150 minutes of moderate-intensity exercise (any activity that increases your heart rate and causes you to sweat) each week. In addition, most adults need muscle-strengthening exercises on 2 or more days a week.   Maintain a healthy weight. The body mass index (BMI) is a screening tool to identify possible weight problems. It provides an estimate of body fat based on height and weight. Your caregiver can help determine your BMI, and can help you achieve or maintain a healthy weight.For adults 20 years and older:   A BMI below 18.5 is considered underweight.   A BMI of 18.5 to 24.9 is normal.   A BMI of 25 to 29.9 is considered overweight.   A BMI of 30 and  above is considered obese.   Maintain normal blood lipids and cholesterol levels by exercising and minimizing your intake of saturated fat. Eat a balanced diet with plenty of fruit and vegetables. Blood tests for lipids and cholesterol should begin at age 20 and be repeated every 5 years. If your lipid or cholesterol levels are high, you are over 50, or you are a high risk for heart disease, you may need your cholesterol levels checked more frequently.Ongoing high lipid and cholesterol levels should be treated with medicines if diet and exercise are not effective.   If you smoke, find out from your caregiver how to quit. If you do not use tobacco, do not start.   If you are pregnant, do not drink alcohol. If you are breastfeeding, be very cautious about drinking alcohol. If you are not pregnant and choose to drink alcohol, do not exceed 1 drink per day. One drink is considered to be 12 ounces (355 mL) of beer, 5 ounces (148 mL) of wine, or 1.5 ounces (44 mL) of liquor.   Avoid use of street drugs. Do not share needles with anyone. Ask for help if you need support or instructions about stopping the use of drugs.   High blood pressure causes heart disease and increases the risk of stroke. Your blood pressure should be checked at least every 1 to 2 years. Ongoing high blood pressure should be treated with medicines if weight loss and exercise are not effective.   If you are 55 to 47   years old, ask your caregiver if you should take aspirin to prevent strokes.   Diabetes screening involves taking a blood sample to check your fasting blood sugar level. This should be done once every 3 years, after age 45, if you are within normal weight and without risk factors for diabetes. Testing should be considered at a younger age or be carried out more frequently if you are overweight and have at least 1 risk factor for diabetes.   Breast cancer screening is essential preventative care for women. You should  practice "breast self-awareness." This means understanding the normal appearance and feel of your breasts and may include breast self-examination. Any changes detected, no matter how small, should be reported to a caregiver. Women in their 20s and 30s should have a clinical breast exam (CBE) by a caregiver as part of a regular health exam every 1 to 3 years. After age 40, women should have a CBE every year. Starting at age 40, women should consider having a mammogram (breast X-ray) every year. Women who have a family history of breast cancer should talk to their caregiver about genetic screening. Women at a high risk of breast cancer should talk to their caregiver about having an MRI and a mammogram every year.   The Pap test is a screening test for cervical cancer. A Pap test can show cell changes on the cervix that might become cervical cancer if left untreated. A Pap test is a procedure in which cells are obtained and examined from the lower end of the uterus (cervix).   Women should have a Pap test starting at age 21.   Between ages 21 and 29, Pap tests should be repeated every 2 years.   Beginning at age 30, you should have a Pap test every 3 years as long as the past 3 Pap tests have been normal.   Some women have medical problems that increase the chance of getting cervical cancer. Talk to your caregiver about these problems. It is especially important to talk to your caregiver if a new problem develops soon after your last Pap test. In these cases, your caregiver may recommend more frequent screening and Pap tests.   The above recommendations are the same for women who have or have not gotten the vaccine for human papillomavirus (HPV).   If you had a hysterectomy for a problem that was not cancer or a condition that could lead to cancer, then you no longer need Pap tests. Even if you no longer need a Pap test, a regular exam is a good idea to make sure no other problems are starting.   If you  are between ages 65 and 70, and you have had normal Pap tests going back 10 years, you no longer need Pap tests. Even if you no longer need a Pap test, a regular exam is a good idea to make sure no other problems are starting.   If you have had past treatment for cervical cancer or a condition that could lead to cancer, you need Pap tests and screening for cancer for at least 20 years after your treatment.   If Pap tests have been discontinued, risk factors (such as a new sexual partner) need to be reassessed to determine if screening should be resumed.   The HPV test is an additional test that may be used for cervical cancer screening. The HPV test looks for the virus that can cause the cell changes on the cervix. The cells collected   during the Pap test can be tested for HPV. The HPV test could be used to screen women aged 30 years and older, and should be used in women of any age who have unclear Pap test results. After the age of 30, women should have HPV testing at the same frequency as a Pap test.   Colorectal cancer can be detected and often prevented. Most routine colorectal cancer screening begins at the age of 50 and continues through age 75. However, your caregiver may recommend screening at an earlier age if you have risk factors for colon cancer. On a yearly basis, your caregiver may provide home test kits to check for hidden blood in the stool. Use of a small camera at the end of a tube, to directly examine the colon (sigmoidoscopy or colonoscopy), can detect the earliest forms of colorectal cancer. Talk to your caregiver about this at age 50, when routine screening begins. Direct examination of the colon should be repeated every 5 to 10 years through age 75, unless early forms of pre-cancerous polyps or small growths are found.   Practice safe sex. Use condoms and avoid high-risk sexual practices to reduce the spread of sexually transmitted infections (STIs). STIs include gonorrhea,  chlamydia, syphilis, trichomonas, herpes, HPV, and human immunodeficiency virus (HIV). Herpes, HIV, and HPV are viral illnesses that have no cure. They can result in disability, cancer, and death. Sexually active women aged 25 and younger should be checked for Chlamydia. Older women with new or multiple partners should also be tested for Chlamydia. Testing for other STIs is recommended if you are sexually active and at increased risk.   Osteoporosis is a disease in which the bones lose minerals and strength with aging. This can result in serious bone fractures. The risk of osteoporosis can be identified using a bone density scan. Women ages 65 and over and women at risk for fractures or osteoporosis should discuss screening with their caregivers. Ask your caregiver whether you should take a calcium supplement or vitamin D to reduce the rate of osteoporosis.   Menopause can be associated with physical symptoms and risks. Hormone replacement therapy is available to decrease symptoms and risks. You should talk to your caregiver about whether hormone replacement therapy is right for you.   Use sunscreen with skin protection factor (SPF) of 30 or more. Apply sunscreen liberally and repeatedly throughout the day. You should seek shade when your shadow is shorter than you. Protect yourself by wearing long sleeves, pants, a wide-brimmed hat, and sunglasses year round, whenever you are outdoors.   Once a month, do a whole body skin exam, using a mirror to look at the skin on your back. Notify your caregiver of new moles, moles that have irregular borders, moles that are larger than a pencil eraser, or moles that have changed in shape or color.   Stay current with required immunizations.   Influenza. You need a dose every fall (or winter). The composition of the flu vaccine changes each year, so being vaccinated once is not enough.   Pneumococcal polysaccharide. You need 1 to 2 doses if you smoke cigarettes or  if you have certain chronic medical conditions. You need 1 dose at age 65 (or older) if you have never been vaccinated.   Tetanus, diphtheria, pertussis (Tdap, Td). Get 1 dose of Tdap vaccine if you are younger than age 65 years, are over 65 and have contact with an infant, are a healthcare worker, are pregnant, or simply want   to be protected from whooping cough. After that, you need a Td booster dose every 10 years. Consult your caregiver if you have not had at least 3 tetanus and diphtheria-containing shots sometime in your life or have a deep or dirty wound.   HPV. You need this vaccine if you are a woman age 26 years or younger. The vaccine is given in 3 doses over 6 months.   Measles, mumps, rubella (MMR). You need at least 1 dose of MMR if you were born in 1957 or later. You may also need a 2nd dose.   Meningococcal. If you are age 19 to 21 years and a first-year college student living in a residence hall, or have one of several medical conditions, you need to get vaccinated against meningococcal disease. You may also need additional booster doses.   Zoster (shingles). If you are age 60 years or older, you should get this vaccine.   Varicella (chickenpox). If you have never had chickenpox or you were vaccinated but received only 1 dose, talk to your caregiver to find out if you need this vaccine.   Hepatitis A. You need this vaccine if you have a specific risk factor for hepatitis A virus infection or you simply wish to be protected from this disease. The vaccine is usually given as 2 doses, 6 to 18 months apart.   Hepatitis B. You need this vaccine if you have a specific risk factor for hepatitis B virus infection or you simply wish to be protected from this disease. The vaccine is given in 3 doses, usually over 6 months.  Preventative Services / Frequency Ages 19 to 39  Blood pressure check.** / Every 1 to 2 years.   Lipid and cholesterol check.**/ Every 5 years beginning at age 20.    Clinical breast exam.** / Every 3 years for women in their 20s and 30s.   Pap Test.** / Every 2 years from ages 21 through 29. Every 3 years starting at age 30 years through age 65 or 70 with a history of 3 consecutive normal Pap tests.   HPV Screening.** / Every 3 years from ages 30 through ages 65 to 70 with a history of 3 consecutive normal Pap tests.   Skin self-exam. / Monthly.   Influenza immunization.** / Every year.   Pneumococcal polysaccharide immunization.** / 1 to 2 doses if you smoke cigarettes or if you have certain chronic medical conditions.   Tetanus, diphtheria, pertussis (Tdap,Td) immunization. / A one-time dose of Tdap vaccine. After that, you need a Td booster dose every 10 years.   HPV immunization. / 3 doses over 6 months, if 26 and younger.   Measles, mumps, rubella (MMR) immunization. / You need at least 1 dose of MMR if you were born in 1957 or later. You may also need a 2nd dose.   Meningococcal immunization. / 1 dose if you are age 19 to 21 years and a first-year college student living in a residence hall, or have one of several medical conditions, you need to get vaccinated against meningococcal disease. You may also need additional booster doses.   Varicella immunization. **/ Consult your caregiver.   Hepatitis A immunization. ** / Consult your caregiver. 2 doses, 6 to 18 months apart.   Hepatitis B immunization.** / Consult your caregiver. 3 doses usually over 6 months.  Ages 40 to 64  Blood pressure check.** / Every 1 to 2 years.   Lipid and cholesterol check.**/ Every 5 years beginning   at age 20.   Clinical breast exam.** / Every year after age 40.   Mammogram.** / Every year beginning at age 40 and continuing for as long as you are in good health. Consult with your caregiver.   Pap Test.** / Every 3 years starting at age 30 years through age 65 or 70 with a history of 3 consecutive normal Pap tests.   HPV Screening.** / Every 3 years from  ages 30 through ages 65 to 70 with a history of 3 consecutive normal Pap tests.   Fecal occult blood test (FOBT) of stool. / Every year beginning at age 50 and continuing until age 75. You may not have to do this test if you get colonoscopy every 10 years.   Flexible sigmoidoscopy** or colonoscopy.** / Every 5 years for a flexible sigmoidoscopy or every 10 years for a colonoscopy beginning at age 50 and continuing until age 75.   Skin self-exam. / Monthly.   Influenza immunization.** / Every year.   Pneumococcal polysaccharide immunization.** / 1 to 2 doses if you smoke cigarettes or if you have certain chronic medical conditions.   Tetanus, diphtheria, pertussis (Tdap/Td) immunization.** / A one-time dose of Tdap vaccine. After that, you need a Td booster dose every 10 years.   Measles, mumps, rubella (MMR) immunization. / You need at least 1 dose of MMR if you were born in 1957 or later. You may also need a 2nd dose.   Varicella immunization. **/ Consult your caregiver.   Meningococcal immunization.** / Consult your caregiver.     Hepatitis A immunization. ** / Consult your caregiver. 2 doses, 6 to 18 months apart.   Hepatitis B immunization.** / Consult your caregiver. 3 doses, usually over 6 months.  Ages 65 and over  Blood pressure check.** / Every 1 to 2 years.   Lipid and cholesterol check.**/ Every 5 years beginning at age 20.   Clinical breast exam.** / Every year after age 40.   Mammogram.** / Every year beginning at age 40 and continuing for as long as you are in good health. Consult with your caregiver.   Pap Test,** / Every 3 years starting at age 30 years through age 65 or 70 with a 3 consecutive normal Pap tests. Testing can be stopped between 65 and 70 with 3 consecutive normal Pap tests and no abnormal Pap or HPV tests in the past 10 years.   HPV Screening.** / Every 3 years from ages 30 through ages 65 or 70 with a history of 3 consecutive normal Pap tests.  Testing can be stopped between 65 and 70 with 3 consecutive normal Pap tests and no abnormal Pap or HPV tests in the past 10 years.   Fecal occult blood test (FOBT) of stool. / Every year beginning at age 50 and continuing until age 75. You may not have to do this test if you get colonoscopy every 10 years.   Flexible sigmoidoscopy** or colonoscopy.** / Every 5 years for a flexible sigmoidoscopy or every 10 years for a colonoscopy beginning at age 50 and continuing until age 75.   Osteoporosis screening.** / A one-time screening for women ages 65 and over and women at risk for fractures or osteoporosis.   Skin self-exam. / Monthly.   Influenza immunization.** / Every year.   Pneumococcal polysaccharide immunization.** / 1 dose at age 65 (or older) if you have never been vaccinated.   Tetanus, diphtheria, pertussis (Tdap, Td) immunization. / A one-time dose of Tdap   vaccine if you are over 65 and have contact with an infant, are a Research scientist (physical sciences), or simply want to be protected from whooping cough. After that, you need a Td booster dose every 10 years.   Varicella immunization. **/ Consult your caregiver.   Meningococcal immunization.** / Consult your caregiver.   Hepatitis A immunization. ** / Consult your caregiver. 2 doses, 6 to 18 months apart.   Hepatitis B immunization.** / Check with your caregiver. 3 doses, usually over 6 months.  ** Family history and personal history of risk and conditions may change your caregiver's recommendations. Document Released: 08/21/2001 Document Revised: 03/07/2011 Document Reviewed: 11/20/2010 [facility] Patient Information 2012 [place], [place].Fibroids You have been diagnosed as having a fibroid. Fibroids are smooth muscle lumps (tumors) which can occur any place in a woman's body. They are usually in the womb (uterus). The most common problem (symptom) of fibroids is bleeding. Over time this may cause low red blood cells (anemia). Other symptoms include  feelings of pressure and pain in the pelvis. The diagnosis (learning what is wrong) of fibroids is made by physical exam. Sometimes tests such as an ultrasound are used. This is helpful when fibroids are felt around the ovaries and to look for tumors. TREATMENT   Most fibroids do not need surgical or medical treatment. Sometimes a tissue sample (biopsy) of the lining of the uterus is done to rule out cancer. If there is no cancer and only a small amount of bleeding, the problem can be watched.   Hormonal treatment can improve the problem.   When surgery is needed, it can consist of removing the fibroid. Vaginal birth may not be possible after the removal of fibroids. This depends on where they are and the extent of surgery. When pregnancy occurs with fibroids it is usually normal.   Your caregiver can help decide which treatments are best for you.  HOME CARE INSTRUCTIONS   Do not use aspirin as this may increase bleeding problems.   If your periods (menses) are heavy, record the number of pads or tampons used per month. Bring this information to your caregiver. This can help them determine the best treatment for you.  SEEK IMMEDIATE MEDICAL CARE IF:  You have pelvic pain or cramps not controlled with medications, or experience a sudden increase in pain.   You have an increase of pelvic bleeding between and during menses.   You feel lightheaded or have fainting spells.   You develop worsening belly (abdominal) pain.  Document Released: 06/22/2000 Document Revised: 03/07/2011 Document Reviewed: 02/12/2008 [facility] Patient Information 2012 [place], [place].

## 2011-05-03 ENCOUNTER — Encounter: Payer: Self-pay | Admitting: Advanced Practice Midwife

## 2011-05-09 ENCOUNTER — Emergency Department (HOSPITAL_COMMUNITY): Payer: Medicare Other

## 2011-05-09 ENCOUNTER — Emergency Department (HOSPITAL_COMMUNITY)
Admission: EM | Admit: 2011-05-09 | Discharge: 2011-05-09 | Disposition: A | Discharge status: Home / Self Care | Payer: Medicare Other | Attending: Emergency Medicine | Admitting: Emergency Medicine

## 2011-05-09 DIAGNOSIS — I10 Essential (primary) hypertension: Secondary | ICD-10-CM | POA: Insufficient documentation

## 2011-05-09 DIAGNOSIS — R6884 Jaw pain: Secondary | ICD-10-CM | POA: Insufficient documentation

## 2011-05-09 DIAGNOSIS — R945 Abnormal results of liver function studies: Secondary | ICD-10-CM | POA: Insufficient documentation

## 2011-05-09 DIAGNOSIS — J45909 Unspecified asthma, uncomplicated: Secondary | ICD-10-CM | POA: Insufficient documentation

## 2011-05-09 DIAGNOSIS — Z862 Personal history of diseases of the blood and blood-forming organs and certain disorders involving the immune mechanism: Secondary | ICD-10-CM | POA: Insufficient documentation

## 2011-05-09 DIAGNOSIS — Z8639 Personal history of other endocrine, nutritional and metabolic disease: Secondary | ICD-10-CM | POA: Insufficient documentation

## 2011-05-09 DIAGNOSIS — Z79899 Other long term (current) drug therapy: Secondary | ICD-10-CM | POA: Insufficient documentation

## 2011-05-09 DIAGNOSIS — R079 Chest pain, unspecified: Secondary | ICD-10-CM | POA: Insufficient documentation

## 2011-05-09 DIAGNOSIS — R10811 Right upper quadrant abdominal tenderness: Secondary | ICD-10-CM | POA: Insufficient documentation

## 2011-05-09 LAB — DIFFERENTIAL
Basophils Absolute: 0 K/uL (ref 0.0–0.1)
Basophils Relative: 0 % (ref 0–1)
Eosinophils Absolute: 0.1 10*3/uL (ref 0.0–0.7)
Eosinophils Relative: 1 % (ref 0–5)
Lymphocytes Relative: 56 % — ABNORMAL HIGH (ref 12–46)
Lymphs Abs: 4 10*3/uL (ref 0.7–4.0)
Monocytes Absolute: 0.6 10*3/uL (ref 0.1–1.0)
Monocytes Relative: 8 % (ref 3–12)
Neutro Abs: 2.5 K/uL (ref 1.7–7.7)
Neutrophils Relative %: 35 % — ABNORMAL LOW (ref 43–77)

## 2011-05-09 LAB — COMPREHENSIVE METABOLIC PANEL
AST: 68 U/L — ABNORMAL HIGH (ref 0–37)
Alkaline Phosphatase: 65 U/L (ref 39–117)
CO2: 25 mEq/L (ref 19–32)
Calcium: 9.5 mg/dL (ref 8.4–10.5)
Creatinine, Ser: 0.62 mg/dL (ref 0.50–1.10)
Glucose, Bld: 85 mg/dL (ref 70–99)
Sodium: 138 mEq/L (ref 135–145)
Total Bilirubin: 0.4 mg/dL (ref 0.3–1.2)
Total Protein: 8.2 g/dL (ref 6.0–8.3)

## 2011-05-09 LAB — LIPASE, BLOOD: Lipase: 31 U/L (ref 11–59)

## 2011-05-09 LAB — APTT: aPTT: 29 seconds (ref 24–37)

## 2011-05-09 LAB — BASIC METABOLIC PANEL
CO2: 24 mEq/L (ref 19–32)
Calcium: 9.4 mg/dL (ref 8.4–10.5)
Chloride: 102 mEq/L (ref 96–112)
GFR calc Af Amer: >90 mL/min (ref >90)
Sodium: 138 mEq/L (ref 135–145)

## 2011-05-09 LAB — CBC
HCT: 44.6 % (ref 36.0–46.0)
Hemoglobin: 15 g/dL (ref 12.0–15.0)
MCH: 30.7 pg (ref 26.0–34.0)
MCHC: 33.6 g/dL (ref 30.0–36.0)
MCV: 91.4 fL (ref 78.0–100.0)
Platelets: 236 K/uL (ref 150–400)
RBC: 4.88 MIL/uL (ref 3.87–5.11)
RDW: 14.7 % (ref 11.5–15.5)
WBC: 7.2 K/uL (ref 4.0–10.5)

## 2011-05-09 LAB — PROTIME-INR
INR: 0.91 (ref 0.00–1.49)
Prothrombin Time: 12.5 s (ref 11.6–15.2)

## 2011-05-09 LAB — BASIC METABOLIC PANEL WITH GFR
BUN: 16 mg/dL (ref 6–23)
Creatinine, Ser: 0.58 mg/dL (ref 0.50–1.10)
GFR calc non Af Amer: >90 mL/min (ref >90)
Glucose, Bld: 88 mg/dL (ref 70–99)
Potassium: 3.1 meq/L — ABNORMAL LOW (ref 3.5–5.1)

## 2011-05-09 LAB — POCT I-STAT TROPONIN I: Troponin i, poc: 0 ng/mL (ref 0.00–0.08)

## 2011-05-14 ENCOUNTER — Encounter: Payer: Self-pay | Admitting: Advanced Practice Midwife

## 2011-05-15 ENCOUNTER — Ambulatory Visit: Payer: Medicare Other

## 2011-05-23 ENCOUNTER — Emergency Department (HOSPITAL_COMMUNITY): Payer: Medicare Other

## 2011-05-23 ENCOUNTER — Encounter (HOSPITAL_COMMUNITY): Payer: Self-pay | Admitting: Emergency Medicine

## 2011-05-23 ENCOUNTER — Other Ambulatory Visit: Payer: Self-pay

## 2011-05-23 ENCOUNTER — Emergency Department (HOSPITAL_COMMUNITY)
Admission: EM | Admit: 2011-05-23 | Discharge: 2011-05-23 | Discharge status: Against Medical Advice | Payer: Medicare Other | Attending: Emergency Medicine | Admitting: Emergency Medicine

## 2011-05-23 ENCOUNTER — Emergency Department
Admission: RE | Admit: 2011-05-23 | Discharge: 2011-05-23 | Disposition: A | Discharge status: Home / Self Care | Payer: Medicare Other | Source: Ambulatory Visit | Attending: Internal Medicine | Admitting: Internal Medicine

## 2011-05-23 DIAGNOSIS — Z79899 Other long term (current) drug therapy: Secondary | ICD-10-CM | POA: Insufficient documentation

## 2011-05-23 DIAGNOSIS — F3289 Other specified depressive episodes: Secondary | ICD-10-CM | POA: Insufficient documentation

## 2011-05-23 DIAGNOSIS — Z1231 Encounter for screening mammogram for malignant neoplasm of breast: Secondary | ICD-10-CM

## 2011-05-23 DIAGNOSIS — I10 Essential (primary) hypertension: Secondary | ICD-10-CM | POA: Insufficient documentation

## 2011-05-23 DIAGNOSIS — J45909 Unspecified asthma, uncomplicated: Secondary | ICD-10-CM | POA: Insufficient documentation

## 2011-05-23 DIAGNOSIS — R071 Chest pain on breathing: Secondary | ICD-10-CM | POA: Insufficient documentation

## 2011-05-23 DIAGNOSIS — F329 Major depressive disorder, single episode, unspecified: Secondary | ICD-10-CM | POA: Insufficient documentation

## 2011-05-23 DIAGNOSIS — R059 Cough, unspecified: Secondary | ICD-10-CM | POA: Insufficient documentation

## 2011-05-23 DIAGNOSIS — R05 Cough: Secondary | ICD-10-CM | POA: Insufficient documentation

## 2011-05-23 MED ORDER — HYDROCODONE-ACETAMINOPHEN 5-325 MG PO TABS
2.0000 | ORAL_TABLET | Freq: Once | ORAL | Status: AC
  Administered 2011-05-23: 2 via ORAL

## 2011-05-23 MED ORDER — HYDROCODONE-ACETAMINOPHEN 5-325 MG PO TABS
ORAL_TABLET | ORAL | Status: AC
  Filled 2011-05-23: qty 1

## 2011-05-23 NOTE — ED Notes (Signed)
Was seen last  Here for cp and was told she had infection in chest  And now she is still having rt chest pain

## 2011-05-23 NOTE — ED Notes (Signed)
Pt called x 2 with no answer  
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
## 2011-05-24 LAB — POCT I-STAT, CHEM 8
BUN: 19 mg/dL (ref 6–23)
Calcium, Ion: 1.01 mmol/L — ABNORMAL LOW (ref 1.12–1.32)
Creatinine, Ser: 0.7 mg/dL (ref 0.50–1.10)
TCO2: 24 mmol/L (ref 0–100)

## 2011-08-02 ENCOUNTER — Ambulatory Visit: Payer: Medicare Other

## 2011-08-06 ENCOUNTER — Ambulatory Visit (INDEPENDENT_AMBULATORY_CARE_PROVIDER_SITE_OTHER): Payer: Medicare Other | Admitting: *Deleted

## 2011-08-06 VITALS — BP 128/88 | HR 76 | Temp 98.1°F | Ht 61.5 in

## 2011-08-06 DIAGNOSIS — D219 Benign neoplasm of connective and other soft tissue, unspecified: Secondary | ICD-10-CM

## 2011-08-06 DIAGNOSIS — D259 Leiomyoma of uterus, unspecified: Secondary | ICD-10-CM

## 2011-08-06 MED ORDER — LEUPROLIDE ACETATE (3 MONTH) 11.25 MG IM KIT
11.2500 mg | PACK | Freq: Once | INTRAMUSCULAR | Status: AC
  Administered 2011-08-06: 11.25 mg via INTRAMUSCULAR

## 2011-10-22 ENCOUNTER — Ambulatory Visit: Payer: Medicare Other

## 2011-10-23 ENCOUNTER — Encounter: Payer: Self-pay | Admitting: Obstetrics and Gynecology

## 2011-10-23 ENCOUNTER — Ambulatory Visit (INDEPENDENT_AMBULATORY_CARE_PROVIDER_SITE_OTHER): Payer: Medicare Other

## 2011-10-23 DIAGNOSIS — N939 Abnormal uterine and vaginal bleeding, unspecified: Secondary | ICD-10-CM

## 2011-10-23 NOTE — Progress Notes (Signed)
Pt did not receive Depo Lupron due to last office visit on 05/02/11 via Wynelle Bourgeois, pt was to follow up in 3 months.  I advised pt to make a follow up appt in which during that time she may receive her Depo Lupron injection.  Pt stated understanding and Antoniette stated that she would call pt with appt due to pt having to catch the bus.

## 2011-11-28 ENCOUNTER — Ambulatory Visit (INDEPENDENT_AMBULATORY_CARE_PROVIDER_SITE_OTHER): Payer: Medicare Other | Admitting: Physician Assistant

## 2011-11-28 ENCOUNTER — Encounter: Payer: Self-pay | Admitting: Physician Assistant

## 2011-11-28 ENCOUNTER — Ambulatory Visit: Payer: Medicare Other | Admitting: Physician Assistant

## 2011-11-28 VITALS — BP 139/98 | HR 80 | Temp 97.7°F | Resp 20 | Ht 61.5 in | Wt 177.6 lb

## 2011-11-28 DIAGNOSIS — D259 Leiomyoma of uterus, unspecified: Secondary | ICD-10-CM

## 2011-11-28 DIAGNOSIS — D219 Benign neoplasm of connective and other soft tissue, unspecified: Secondary | ICD-10-CM

## 2011-11-28 DIAGNOSIS — R102 Pelvic and perineal pain: Secondary | ICD-10-CM

## 2011-11-28 DIAGNOSIS — N949 Unspecified condition associated with female genital organs and menstrual cycle: Secondary | ICD-10-CM

## 2011-11-28 MED ORDER — LEUPROLIDE ACETATE (3 MONTH) 11.25 MG IM KIT
11.2500 mg | PACK | Freq: Once | INTRAMUSCULAR | Status: AC
  Administered 2011-11-28: 11.25 mg via INTRAMUSCULAR

## 2011-11-28 NOTE — Patient Instructions (Signed)
Leuprolide depot injection or implant What is this medicine? LEUPROLIDE (loo PROE lide) is a man-made protein that acts like a natural hormone in the body. It decreases testosterone in men and decreases estrogen in women. In men, this medicine is used to treat advanced prostate cancer. In women, some forms of this medicine may be used to treat endometriosis, uterine fibroids, or other female hormone-related problems. This medicine may be used for other purposes; ask your health care provider or pharmacist if you have questions. What should I tell my health care provider before I take this medicine? They need to know if you have any of these conditions: -diabetes -heart disease or previous heart attack -high blood pressure -high cholesterol -osteoporosis -pain or difficulty passing urine -spinal cord metastasis -stroke -tobacco smoker -unusual vaginal bleeding (women) -an unusual or allergic reaction to leuprolide, benzyl alcohol, other medicines, foods, dyes, or preservatives -pregnant or trying to get pregnant -breast-feeding How should I use this medicine? This medicine is for injection into a muscle or for implant or injection under the skin. It is given by a health care professional in a hospital or clinic setting. The specific product will determine how it will be given to you. Make sure you understand which product you receive and how often you will receive it. Talk to your pediatrician regarding the use of this medicine in children. Special care may be needed. Overdosage: If you think you have taken too much of this medicine contact a poison control center or emergency room at once. NOTE: This medicine is only for you. Do not share this medicine with others. What if I miss a dose? It is important not to miss a dose. Call your doctor or health care professional if you are unable to keep an appointment. Depot injections: Depot injections are given either once-monthly, every 12 weeks,  every 16 weeks, or every 24 weeks depending on the product you are prescribed. The product you are prescribed will be based on if you are female or female, and your condition. Make sure you understand your product and dosing. Implant dosing: The implant is removed and replaced once a year. The implant is only used in males. What may interact with this medicine? Do not take this medicine with any of the following medications: -chasteberry This medicine may also interact with the following medications: -herbal or dietary supplements, like black cohosh or DHEA -female hormones, like estrogens or progestins and birth control pills, patches, rings, or injections -female hormones, like testosterone This list may not describe all possible interactions. Give your health care provider a list of all the medicines, herbs, non-prescription drugs, or dietary supplements you use. Also tell them if you smoke, drink alcohol, or use illegal drugs. Some items may interact with your medicine. What should I watch for while using this medicine? Visit your doctor or health care professional for regular checks on your progress. During the first weeks of treatment, your symptoms may get worse, but then will improve as you continue your treatment. You may get hot flashes, increased bone pain, increased difficulty passing urine, or an aggravation of nerve symptoms. Discuss these effects with your doctor or health care professional, some of them may improve with continued use of this medicine. Female patients may experience a menstrual cycle or spotting during the first months of therapy with this medicine. If this continues, contact your doctor or health care professional. What side effects may I notice from receiving this medicine? Side effects that you should report  to your doctor or health care professional as soon as possible: -allergic reactions like skin rash, itching or hives, swelling of the face, lips, or  tongue -breathing problems -chest pain -depression or memory disorders -pain in your legs or groin -pain at site where injected or implanted -severe headache -swelling of the feet and legs -visual changes -vomiting Side effects that usually do not require medical attention (report to your doctor or health care professional if they continue or are bothersome): -breast swelling or tenderness -decrease in sex drive or performance -diarrhea -hot flashes -loss of appetite -muscle, joint, or bone pains -nausea -redness or irritation at site where injected or implanted -skin problems or acne This list may not describe all possible side effects. Call your doctor for medical advice about side effects. You may report side effects to FDA at 1-800-FDA-1088. Where should I keep my medicine? This drug is given in a hospital or clinic and will not be stored at home. NOTE: This sheet is a summary. It may not cover all possible information. If you have questions about this medicine, talk to your doctor, pharmacist, or health care provider.  2012, Elsevier/Gold Standard. (12/27/2009 2:41:21 PM)

## 2011-12-12 ENCOUNTER — Telehealth: Payer: Self-pay

## 2011-12-12 NOTE — Telephone Encounter (Signed)
Pt stated that she received the Depo shot and has been "buring up ever since" can someone please give me a call back?

## 2011-12-13 ENCOUNTER — Telehealth: Payer: Self-pay | Admitting: *Deleted

## 2011-12-13 NOTE — Telephone Encounter (Signed)
Telephoned patient at home # and patient states is having major hot flashes and states Dr. Okey Dupre gave her something to take for these. Patient can not remember the name of the med but would like this called in to the [facility] pharmacy. Advised patient would send this to the provider that saw her and would giver her a call back if they will prescribe her something.  Stoney Bang [place], [place] 12/09/9526 4:13 AM Signed  Telephoned patient at home # and left message to return call to clinic. Patient received Lupron injection and need to advise her that a side effect is hot flashes.  Ralene Bathe [place], LPN 08/12/4008 2:72 PM Signed  Pt stated that she received the Depo shot and has been "buring up ever since" can someone please give me a call back?

## 2011-12-13 NOTE — Telephone Encounter (Signed)
Telephoned patient at home # and patient states is having major hot flashes and states Dr. Okey Dupre gave her something to take for these. Patient can not remember the name of the med but would like this called in to the [facility] pharmacy. Advised patient would send this to the provider that saw her and would giver her a call back if they will prescribe her something.

## 2011-12-13 NOTE — Telephone Encounter (Signed)
Telephoned patient at home # and left message to return call to clinic. Patient received Lupron injection and need to advise her that a side effect is hot flashes.

## 2011-12-17 ENCOUNTER — Telehealth: Payer: Self-pay

## 2011-12-17 NOTE — Telephone Encounter (Signed)
Pt called and stated can someone call her back about her hot flashes.

## 2011-12-18 MED ORDER — MEDROXYPROGESTERONE ACETATE 5 MG PO TABS: 5.0000 mg | ORAL_TABLET | Freq: Every day | ORAL | Status: DC

## 2011-12-18 NOTE — Telephone Encounter (Signed)
Called pt after consult w/Dr. Shawnie Pons.  I called pt and informed her of Rx sent to her pharmacy. Pt voiced understanding.

## 2012-02-14 ENCOUNTER — Telehealth: Payer: Self-pay | Admitting: *Deleted

## 2012-02-14 NOTE — Telephone Encounter (Signed)
Patient left a message stating that she is on her period and its not time for her depo yet. What should she do?

## 2012-02-14 NOTE — Telephone Encounter (Signed)
Called patient and she is actually able to get her Lupron now. Last dose was 11/28/11, due times are 8/7-8/21. Attempted to call patient but she did not answer. Left her a message to call back. Can tell her that she can come in before 8/22 for her next lupron.

## 2012-02-15 NOTE — Telephone Encounter (Signed)
Called patient about next Lupron injection. She will come Monday afternoon to receive her next injection. The patient voiced understanding.

## 2012-02-18 ENCOUNTER — Ambulatory Visit: Payer: Medicare Other

## 2012-02-18 ENCOUNTER — Ambulatory Visit: Payer: Medicare Other | Admitting: Obstetrics & Gynecology

## 2012-02-18 ENCOUNTER — Ambulatory Visit (INDEPENDENT_AMBULATORY_CARE_PROVIDER_SITE_OTHER): Payer: Medicare Other

## 2012-02-18 VITALS — BP 124/79 | HR 88 | Wt 177.5 lb

## 2012-02-18 DIAGNOSIS — D219 Benign neoplasm of connective and other soft tissue, unspecified: Secondary | ICD-10-CM

## 2012-02-18 DIAGNOSIS — D259 Leiomyoma of uterus, unspecified: Secondary | ICD-10-CM

## 2012-02-18 MED ORDER — LEUPROLIDE ACETATE (3 MONTH) 11.25 MG IM KIT
11.2500 mg | PACK | Freq: Once | INTRAMUSCULAR | Status: AC
  Administered 2012-02-18: 11.25 mg via INTRAMUSCULAR

## 2012-02-28 ENCOUNTER — Ambulatory Visit: Payer: Medicare Other

## 2012-04-08 ENCOUNTER — Telehealth: Payer: Self-pay | Admitting: Medical

## 2012-04-08 NOTE — Telephone Encounter (Signed)
Patient called stating she needs to know when to come in for her depo, she thought it was supposed to be a few weeks early, but needs clarification.

## 2012-04-08 NOTE — Telephone Encounter (Signed)
Chart reviewed. Patient has not been seen by a provider for almost a year. I have made her an appointment for 04/24/12 @ 2:15pm to see a provider regarding long-term management of her DUB. Attempted to contact patient. LM asking her to return call to clinic.

## 2012-04-08 NOTE — Telephone Encounter (Signed)
Chart reviewed. Patient has not been seen in clinic by a provider since 05/03/11. Will make patient appointment for follow-up in clinic before next lupron is due.

## 2012-04-09 ENCOUNTER — Telehealth: Payer: Self-pay | Admitting: General Practice

## 2012-04-09 NOTE — Telephone Encounter (Signed)
Called patient and notified her that she had an appt on October 17 @ 2:15 with Dr. Erin Fulling since she hasn't been seen in over a year about her DUB. Patient stated that someone up front told her to come in 10 days before her shot on the 28th but she didn't know what for. I told patient that was what this appt on the 17th was for to discuss her bleeding before her next injection and that it was important for her to come. Patient verbalized understanding and had no further questions.

## 2012-04-14 ENCOUNTER — Other Ambulatory Visit: Payer: Self-pay | Admitting: Internal Medicine

## 2012-04-14 DIAGNOSIS — Z1231 Encounter for screening mammogram for malignant neoplasm of breast: Secondary | ICD-10-CM

## 2012-04-24 ENCOUNTER — Ambulatory Visit: Payer: Medicare Other | Admitting: Obstetrics & Gynecology

## 2012-05-02 ENCOUNTER — Ambulatory Visit: Payer: Medicare Other | Admitting: Obstetrics & Gynecology

## 2012-05-05 ENCOUNTER — Ambulatory Visit: Payer: Medicare Other

## 2012-05-16 ENCOUNTER — Ambulatory Visit (INDEPENDENT_AMBULATORY_CARE_PROVIDER_SITE_OTHER): Payer: Medicare Other | Admitting: Obstetrics and Gynecology

## 2012-05-16 VITALS — BP 153/101 | HR 77 | Ht 61.5 in | Wt 179.0 lb

## 2012-05-16 DIAGNOSIS — Z01419 Encounter for gynecological examination (general) (routine) without abnormal findings: Secondary | ICD-10-CM

## 2012-05-16 DIAGNOSIS — D219 Benign neoplasm of connective and other soft tissue, unspecified: Secondary | ICD-10-CM

## 2012-05-16 DIAGNOSIS — Z0000 Encounter for general adult medical examination without abnormal findings: Secondary | ICD-10-CM

## 2012-05-16 DIAGNOSIS — D259 Leiomyoma of uterus, unspecified: Secondary | ICD-10-CM

## 2012-05-16 MED ORDER — LEUPROLIDE ACETATE (3 MONTH) 11.25 MG IM KIT
11.2500 mg | PACK | Freq: Once | INTRAMUSCULAR | Status: AC
  Administered 2012-05-16: 11.25 mg via INTRAMUSCULAR

## 2012-05-16 NOTE — Progress Notes (Signed)
  Subjective:    Patient ID: Kristin Bond, female    DOB: [birth year], 46 y.o.   MRN: [id number]  HPI  46 yo AA lady with a know fibroid. She is here today for her depo Lupron shot. She says that she had a pap smear last month at [facility] on [address]. And that she has a mammogram scheduled next week.   Review of Systems     Objective:   Physical Exam        Assessment & Plan:   Fibroids- due for depo Lupron. Injection today 11.25 mg  RTC 3 months for Lupron

## 2012-05-16 NOTE — Progress Notes (Signed)
Change visit from Dr. Marice Potter to nurse visit. Patient states she's had her pap smear done at a different clinic and she only needs her Lupron Depot today. Okay per Dr. Marice Potter.

## 2012-05-23 ENCOUNTER — Ambulatory Visit: Payer: Medicare Other

## 2012-05-30 ENCOUNTER — Ambulatory Visit: Payer: Medicare Other

## 2012-06-12 ENCOUNTER — Telehealth: Payer: Self-pay | Admitting: *Deleted

## 2012-06-12 NOTE — Telephone Encounter (Signed)
Kristin Bond called and left a message stating she has a question- states keeps having hot flashes and requests a call back

## 2012-06-12 NOTE — Telephone Encounter (Signed)
Discussed with Kristin Bond that since she had the Lupron depot shot in November that makes you have menopause like symptoms including hot flashes. Also discussed that Kristin Bond states she has had several Lupron shots- maybe 4- does not have any other appointments. Offered and encouraged her to make an appointment to discuss plan of care and if there would be any other treatment for her fibroids  as usually you don't get Lupron shots long term. Patient declines to make appointment at present but understands she can call back and make an appointment if needed for menopausal symptoms or if her fibroids are bothering her.

## 2012-07-30 ENCOUNTER — Ambulatory Visit: Payer: Medicare Other | Admitting: Obstetrics & Gynecology

## 2012-08-04 ENCOUNTER — Other Ambulatory Visit (HOSPITAL_COMMUNITY)
Admission: RE | Admit: 2012-08-04 | Discharge: 2012-08-04 | Disposition: A | Discharge status: Home / Self Care | Payer: Medicare Other | Source: Ambulatory Visit | Attending: Obstetrics & Gynecology | Admitting: Obstetrics & Gynecology

## 2012-08-04 ENCOUNTER — Ambulatory Visit (INDEPENDENT_AMBULATORY_CARE_PROVIDER_SITE_OTHER): Payer: Medicare Other | Admitting: Obstetrics & Gynecology

## 2012-08-04 ENCOUNTER — Encounter: Payer: Self-pay | Admitting: Obstetrics & Gynecology

## 2012-08-04 VITALS — BP 163/117 | HR 86 | Temp 97.3°F | Ht 61.5 in | Wt 178.0 lb

## 2012-08-04 DIAGNOSIS — Z01419 Encounter for gynecological examination (general) (routine) without abnormal findings: Secondary | ICD-10-CM

## 2012-08-04 DIAGNOSIS — Z0000 Encounter for general adult medical examination without abnormal findings: Secondary | ICD-10-CM

## 2012-08-04 DIAGNOSIS — D259 Leiomyoma of uterus, unspecified: Secondary | ICD-10-CM

## 2012-08-04 DIAGNOSIS — Z1151 Encounter for screening for human papillomavirus (HPV): Secondary | ICD-10-CM | POA: Insufficient documentation

## 2012-08-04 DIAGNOSIS — Z124 Encounter for screening for malignant neoplasm of cervix: Secondary | ICD-10-CM | POA: Insufficient documentation

## 2012-08-04 DIAGNOSIS — D219 Benign neoplasm of connective and other soft tissue, unspecified: Secondary | ICD-10-CM

## 2012-08-04 NOTE — Progress Notes (Signed)
  Subjective:    Patient ID: Kristin Bond, female    DOB: 1965/11/16, 47 y.o.   MRN: [id number]  HPI  Ms. Motter is here today for her next depot Lupron, but she is about 2 weeks early. Upon review of her chart, it appears that she is due for a pap and mammogram.  Review of Systems     Objective:   Physical Exam   Normal appearing cervix,  ULN size uterus, normal adnexa     Assessment & Plan:  Preventative care- pap today and schedule mammogram Fibroids- continue every 12 weeks depot Lupron. Her next one is due 08-16-12.

## 2012-08-11 ENCOUNTER — Encounter: Payer: Self-pay | Admitting: Physician Assistant

## 2012-08-11 NOTE — Progress Notes (Signed)
Patient had questions about Depo Lupron.  All questions answered.  Depo Lupron administered. She will follow up as scheduled.

## 2012-08-15 ENCOUNTER — Ambulatory Visit: Payer: Medicare Other

## 2012-08-15 ENCOUNTER — Ambulatory Visit (HOSPITAL_COMMUNITY): Payer: Medicare Other

## 2012-08-19 ENCOUNTER — Telehealth: Payer: Self-pay | Admitting: *Deleted

## 2012-08-19 NOTE — Telephone Encounter (Signed)
Pt left message stating that she missed her appt on 08/15/12 for Depo Lupron injection and mammogram. She would like to know if she can still have the injection. I called her back and left a message stating that she can re-schedule her appts. She should reschedule the mammo first (tel # provided) and then call us to schedule the Lupron Depo injection for the same Kristin Bond. These appts can be this week or next week.

## 2012-08-25 ENCOUNTER — Other Ambulatory Visit: Payer: Self-pay | Admitting: Obstetrics and Gynecology

## 2012-08-25 ENCOUNTER — Ambulatory Visit (HOSPITAL_COMMUNITY)
Admission: RE | Admit: 2012-08-25 | Discharge: 2012-08-25 | Disposition: A | Discharge status: Home / Self Care | Payer: Medicare Other | Source: Ambulatory Visit | Attending: Obstetrics & Gynecology | Admitting: Obstetrics & Gynecology

## 2012-08-25 ENCOUNTER — Ambulatory Visit (INDEPENDENT_AMBULATORY_CARE_PROVIDER_SITE_OTHER): Payer: Medicare Other | Admitting: *Deleted

## 2012-08-25 VITALS — BP 163/117 | Ht 61.5 in | Wt 178.8 lb

## 2012-08-25 DIAGNOSIS — D259 Leiomyoma of uterus, unspecified: Secondary | ICD-10-CM

## 2012-08-25 DIAGNOSIS — Z1231 Encounter for screening mammogram for malignant neoplasm of breast: Secondary | ICD-10-CM | POA: Insufficient documentation

## 2012-08-25 DIAGNOSIS — D219 Benign neoplasm of connective and other soft tissue, unspecified: Secondary | ICD-10-CM

## 2012-08-25 DIAGNOSIS — Z0000 Encounter for general adult medical examination without abnormal findings: Secondary | ICD-10-CM

## 2012-08-25 MED ORDER — LEUPROLIDE ACETATE (3 MONTH) 11.25 MG IM KIT
11.2500 mg | PACK | Freq: Once | INTRAMUSCULAR | Status: AC
  Administered 2012-08-25: 11.25 mg via INTRAMUSCULAR

## 2012-08-25 NOTE — Progress Notes (Signed)
Pt is past due for her lupron injection. UPT ran with negative result. Pt was found to have elevated blood pressure. I discussed this with Dr. Jolayne Panther who stated that it was okay for patient to have injection but she should call her pcp or go to emergency room.

## 2012-10-28 ENCOUNTER — Telehealth: Payer: Self-pay

## 2012-10-28 NOTE — Telephone Encounter (Addendum)
Patient would like to make an appointment to come in for her next Lupron depo shot.

## 2012-11-17 ENCOUNTER — Ambulatory Visit: Payer: Medicare Other

## 2012-11-17 VITALS — BP 155/110 | HR 79 | Temp 97.5°F | Ht 61.5 in | Wt 177.7 lb

## 2012-11-17 DIAGNOSIS — N938 Other specified abnormal uterine and vaginal bleeding: Secondary | ICD-10-CM

## 2012-11-17 MED ORDER — LEUPROLIDE ACETATE (3 MONTH) 11.25 MG IM KIT
11.2500 mg | PACK | Freq: Once | INTRAMUSCULAR | Status: AC
  Administered 2012-11-17: 11.25 mg via INTRAMUSCULAR

## 2013-02-02 ENCOUNTER — Ambulatory Visit (INDEPENDENT_AMBULATORY_CARE_PROVIDER_SITE_OTHER): Payer: Medicare Other | Admitting: General Practice

## 2013-02-02 VITALS — BP 164/116 | HR 77 | Temp 97.8°F | Ht 61.0 in | Wt 180.1 lb

## 2013-02-02 DIAGNOSIS — D259 Leiomyoma of uterus, unspecified: Secondary | ICD-10-CM

## 2013-02-02 MED ORDER — LEUPROLIDE ACETATE (3 MONTH) 11.25 MG IM KIT
11.2500 mg | PACK | Freq: Once | INTRAMUSCULAR | Status: AC
  Administered 2013-02-02: 11.25 mg via INTRAMUSCULAR

## 2013-02-02 NOTE — Progress Notes (Signed)
Advised patient about concern over her consistently high blood pressures and discussed warning signs of heart attack and stroke with patient. Patient stated she has an appt with her PCP in 3 days. Told patient to discuss with them her high blood pressures. Patient verbalized understanding to all.

## 2013-02-10 ENCOUNTER — Other Ambulatory Visit: Payer: Self-pay | Admitting: Internal Medicine

## 2013-02-10 DIAGNOSIS — Z1231 Encounter for screening mammogram for malignant neoplasm of breast: Secondary | ICD-10-CM

## 2013-03-05 ENCOUNTER — Ambulatory Visit (INDEPENDENT_AMBULATORY_CARE_PROVIDER_SITE_OTHER): Payer: Medicare Other | Admitting: Obstetrics & Gynecology

## 2013-03-05 VITALS — BP 109/75 | HR 88 | Temp 97.7°F | Ht 61.5 in | Wt 177.0 lb

## 2013-03-05 DIAGNOSIS — D219 Benign neoplasm of connective and other soft tissue, unspecified: Secondary | ICD-10-CM

## 2013-03-05 DIAGNOSIS — D259 Leiomyoma of uterus, unspecified: Secondary | ICD-10-CM

## 2013-03-05 MED ORDER — MEDROXYPROGESTERONE ACETATE 5 MG PO TABS: 5.0000 mg | ORAL_TABLET | Freq: Every day | ORAL | Status: DC

## 2013-03-05 MED ORDER — ESTRADIOL 1 MG PO TABS: 1.0000 mg | ORAL_TABLET | Freq: Every day | ORAL | Status: DC

## 2013-03-05 NOTE — Patient Instructions (Signed)

## 2013-03-05 NOTE — Progress Notes (Signed)
Patient ID: Kristin Bond, female   DOB: 02/27/1966, 47 y.o.   MRN: [id number]   No LMP recorded. Patient has had an injection. She has been on Lupron for > 1 year with no bleeding, but her VMS are worsening. Still taking Provera, last Lupron was 2 mo. Ago. Will add estradiol 1 mg and RTC 1 mo. To review progress and consider if Lupron will continue.  Adam Phenix, MD 03/05/2013

## 2013-03-06 ENCOUNTER — Encounter: Payer: Self-pay | Admitting: Obstetrics & Gynecology

## 2013-04-06 ENCOUNTER — Ambulatory Visit: Payer: Medicare Other | Admitting: Obstetrics & Gynecology

## 2013-04-29 ENCOUNTER — Ambulatory Visit: Payer: Medicare Other | Admitting: Obstetrics & Gynecology

## 2013-05-06 ENCOUNTER — Ambulatory Visit (INDEPENDENT_AMBULATORY_CARE_PROVIDER_SITE_OTHER): Payer: Medicare Other | Admitting: Obstetrics & Gynecology

## 2013-05-06 ENCOUNTER — Encounter: Payer: Self-pay | Admitting: Obstetrics & Gynecology

## 2013-05-06 DIAGNOSIS — R102 Pelvic and perineal pain: Secondary | ICD-10-CM

## 2013-05-06 DIAGNOSIS — D259 Leiomyoma of uterus, unspecified: Secondary | ICD-10-CM

## 2013-05-06 DIAGNOSIS — N949 Unspecified condition associated with female genital organs and menstrual cycle: Secondary | ICD-10-CM

## 2013-05-06 DIAGNOSIS — D219 Benign neoplasm of connective and other soft tissue, unspecified: Secondary | ICD-10-CM

## 2013-05-06 MED ORDER — ESTRADIOL 1 MG PO TABS: 1.0000 mg | ORAL_TABLET | Freq: Every day | ORAL | Status: DC

## 2013-05-06 NOTE — Progress Notes (Signed)
  Subjective:    Patient ID: Kristin Bond, female    DOB: March 15, 1966, 47 y.o.   MRN: [id number]  HPI  47 yo lady with fibroids and pelvic pain. She is very happy with Lupron and add back estradiol. She needs a refill of her estradiol. She would like another depo Lupron today.  Review of Systems     Objective:   Physical Exam        Assessment & Plan:  Fibroids and pelvic pain- continue Lupron and estradiol RTC for her injection (the Lupron was out of stock today)

## 2013-05-06 NOTE — Patient Instructions (Signed)
Fibroids Fibroids are lumps (tumors) that can occur any place in a woman's body. These lumps are not cancerous. Fibroids vary in size, weight, and where they grow. HOME CARE  Do not take aspirin.  Write down the number of pads or tampons you use during your period. Tell your doctor. This can help determine the best treatment for you. GET HELP RIGHT AWAY IF:  You have pain in your lower belly (abdomen) that is not helped with medicine.  You have cramps that are not helped with medicine.  You have more bleeding between or during your period.  You feel lightheaded or pass out (faint).  Your lower belly pain gets worse. MAKE SURE YOU:  Understand these instructions.  Will watch your condition.  Will get help right away if you are not doing well or get worse. Document Released: 07/28/2010 Document Revised: 09/17/2011 Document Reviewed: 07/28/2010 [employer] Patient Information 2014 [employer].  

## 2013-05-08 ENCOUNTER — Telehealth: Payer: Self-pay | Admitting: *Deleted

## 2013-05-08 DIAGNOSIS — D219 Benign neoplasm of connective and other soft tissue, unspecified: Secondary | ICD-10-CM

## 2013-05-08 MED ORDER — ESTRADIOL 1 MG PO TABS: 1.0000 mg | ORAL_TABLET | Freq: Every day | ORAL | Status: DC

## 2013-05-08 NOTE — Telephone Encounter (Signed)
Pt called and stated " I cannot afford my prescription, it is 16 dollars and I need a new prescription. I also need a new pill for my hot flashes".   Called patient, pt informed me of what is stated above.  I informed patient that [facility] has the medication on 4 dollar list, asked if she would like for [place] to order the Rx to the [facility] of her choice.  Pt informed me to send Rx for estradiol to [facility] off [address].  Electronically prescribed medication/informed pt to give them a hour and to call before going to pick up medication.  Pt stated understanding.

## 2013-05-11 ENCOUNTER — Encounter: Payer: Medicare Other | Admitting: *Deleted

## 2013-05-11 NOTE — Progress Notes (Signed)
Opened in error

## 2013-05-13 ENCOUNTER — Ambulatory Visit (INDEPENDENT_AMBULATORY_CARE_PROVIDER_SITE_OTHER): Payer: Medicare Other

## 2013-05-13 VITALS — BP 115/82 | HR 84 | Ht 64.0 in | Wt 175.2 lb

## 2013-05-13 DIAGNOSIS — D259 Leiomyoma of uterus, unspecified: Secondary | ICD-10-CM

## 2013-05-13 DIAGNOSIS — D219 Benign neoplasm of connective and other soft tissue, unspecified: Secondary | ICD-10-CM

## 2013-05-13 MED ORDER — LEUPROLIDE ACETATE (3 MONTH) 11.25 MG IM KIT
11.2500 mg | PACK | Freq: Once | INTRAMUSCULAR | Status: AC
  Administered 2013-05-13: 11.25 mg via INTRAMUSCULAR

## 2013-07-15 ENCOUNTER — Other Ambulatory Visit: Payer: Self-pay | Admitting: Internal Medicine

## 2013-07-15 DIAGNOSIS — C220 Liver cell carcinoma: Secondary | ICD-10-CM

## 2013-07-21 ENCOUNTER — Ambulatory Visit
Admission: RE | Admit: 2013-07-21 | Discharge: 2013-07-21 | Disposition: A | Discharge status: Home / Self Care | Payer: Medicare Other | Source: Ambulatory Visit | Attending: Internal Medicine | Admitting: Internal Medicine

## 2013-07-21 DIAGNOSIS — C220 Liver cell carcinoma: Secondary | ICD-10-CM

## 2013-07-22 ENCOUNTER — Other Ambulatory Visit: Payer: Medicare Other

## 2013-07-29 ENCOUNTER — Ambulatory Visit: Payer: Medicare Other

## 2013-07-29 ENCOUNTER — Other Ambulatory Visit (HOSPITAL_COMMUNITY): Payer: Self-pay | Admitting: Internal Medicine

## 2013-07-29 DIAGNOSIS — Z1231 Encounter for screening mammogram for malignant neoplasm of breast: Secondary | ICD-10-CM

## 2013-07-30 ENCOUNTER — Ambulatory Visit: Payer: Medicare Other | Admitting: *Deleted

## 2013-07-30 ENCOUNTER — Other Ambulatory Visit: Payer: Self-pay | Admitting: General Practice

## 2013-07-30 MED ORDER — MELOXICAM 7.5 MG PO TABS: 7.5000 mg | ORAL_TABLET | Freq: Two times a day (BID) | ORAL | Status: DC

## 2013-07-30 NOTE — Progress Notes (Signed)
Patient came into clinic today for depo lupron, dose was not here in the clinic. Today was the first day to get the dose. Reschedule the patient's appt for 1/29 to get depo lupron. Patient is reporting vaginal pain/pelvic pain- Spoke to Dr Harolyn Rutherford who gave verbal order for mobic 7.5mg  BID disp 60 with 2 refills. Informed patient we would be calling in medication for her to her pharmacy. Patient satisfied.

## 2013-08-04 ENCOUNTER — Emergency Department (HOSPITAL_COMMUNITY)
Admission: EM | Admit: 2013-08-04 | Discharge: 2013-08-04 | Disposition: A | Discharge status: Home / Self Care | Payer: Medicare Other | Attending: Emergency Medicine | Admitting: Emergency Medicine

## 2013-08-04 ENCOUNTER — Encounter (HOSPITAL_COMMUNITY): Payer: Self-pay | Admitting: Emergency Medicine

## 2013-08-04 DIAGNOSIS — K089 Disorder of teeth and supporting structures, unspecified: Secondary | ICD-10-CM | POA: Insufficient documentation

## 2013-08-04 DIAGNOSIS — Z8742 Personal history of other diseases of the female genital tract: Secondary | ICD-10-CM | POA: Insufficient documentation

## 2013-08-04 DIAGNOSIS — I10 Essential (primary) hypertension: Secondary | ICD-10-CM | POA: Insufficient documentation

## 2013-08-04 DIAGNOSIS — J45909 Unspecified asthma, uncomplicated: Secondary | ICD-10-CM | POA: Insufficient documentation

## 2013-08-04 DIAGNOSIS — Z79899 Other long term (current) drug therapy: Secondary | ICD-10-CM | POA: Insufficient documentation

## 2013-08-04 DIAGNOSIS — F319 Bipolar disorder, unspecified: Secondary | ICD-10-CM | POA: Insufficient documentation

## 2013-08-04 DIAGNOSIS — F17200 Nicotine dependence, unspecified, uncomplicated: Secondary | ICD-10-CM | POA: Insufficient documentation

## 2013-08-04 DIAGNOSIS — IMO0002 Reserved for concepts with insufficient information to code with codable children: Secondary | ICD-10-CM | POA: Insufficient documentation

## 2013-08-04 DIAGNOSIS — Z791 Long term (current) use of non-steroidal anti-inflammatories (NSAID): Secondary | ICD-10-CM | POA: Insufficient documentation

## 2013-08-04 DIAGNOSIS — K0889 Other specified disorders of teeth and supporting structures: Secondary | ICD-10-CM

## 2013-08-04 MED ORDER — PENICILLIN V POTASSIUM 500 MG PO TABS: 500.0000 mg | ORAL_TABLET | Freq: Four times a day (QID) | ORAL | Status: DC

## 2013-08-04 MED ORDER — NAPROXEN 250 MG PO TABS
500.0000 mg | ORAL_TABLET | Freq: Once | ORAL | Status: AC
  Administered 2013-08-04: 500 mg via ORAL
  Filled 2013-08-04: qty 2

## 2013-08-04 MED ORDER — NAPROXEN 500 MG PO TABS: 500.0000 mg | ORAL_TABLET | Freq: Two times a day (BID) | ORAL | Status: DC

## 2013-08-04 MED ORDER — PENICILLIN V POTASSIUM 250 MG PO TABS
500.0000 mg | ORAL_TABLET | Freq: Four times a day (QID) | ORAL | Status: DC
  Administered 2013-08-04: 500 mg via ORAL
  Filled 2013-08-04: qty 2
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
## 2013-08-04 NOTE — Discharge Instructions (Signed)
Take naprosyn as needed for pain. Take Veetid as directed until gone. Follow up with a dentist from the resource guide below. Call today to make an appointment.    Emergency Department Resource Guide 1) Find a Doctor and Pay Out of Pocket Although you won't have to find out who is covered by your insurance plan, it is a good idea to ask around and get recommendations. You will then need to call the office and see if the doctor you have chosen will accept you as a new patient and what types of options they offer for patients who are self-pay. Some doctors offer discounts or will set up payment plans for their patients who do not have insurance, but you will need to ask so you aren't surprised when you get to your appointment.  2) Contact Your Local Health Department Not all health departments have doctors that can see patients for sick visits, but many do, so it is worth a call to see if yours does. If you don't know where your local health department is, you can check in your phone book. The CDC also has a tool to help you locate your state's health department, and many state websites also have listings of all of their local health departments.  3) Find a [facility] If your illness is not likely to be very severe or complicated, you may want to try a walk in clinic. These are popping up all over the country in pharmacies, drugstores, and shopping centers. They're usually staffed by nurse practitioners or physician assistants that have been trained to treat common illnesses and complaints. They're usually fairly quick and inexpensive. However, if you have serious medical issues or chronic medical problems, these are probably not your best option.  No Primary Care Doctor: - Call Health Connect at  [phone] - they can help you locate a primary care doctor that  accepts your insurance, provides certain services, etc. - Physician Referral Service- [phone]  Chronic Pain  Problems: Organization         Address  Phone   Notes  [facility]  [phone] Patients need to be referred by their primary care doctor.   Medication Assistance: Organization         Address  Phone   Notes  [facility] [address] [facility] [address] [place], [place] [address] [phone] --Must be a resident of [facility] -- Must have NO insurance coverage whatsoever (no Medicaid/ Medicare, etc.) -- The pt. MUST have a primary care doctor that directs their care regularly and follows them in the community   Med[facility]ist  [phone]   [employer]  [phone]    Agencies that provide inexpensive medical care: Organization         Address  Phone   Notes  [place]  [phone]   [facility]    [phone]   [facility] [place], [place] [address] [phone]   [place] [address], [place] [phone]   Planned Parenthood    [phone]   [facility]    [phone]   [place] and [place] [address], [place] Phone:  [phone], Fax:  [phone] Hours of Operation:  9 am - 6 pm, M-F.  Also accepts Medicaid/Medicare and self-pay.  [facility] for [facility] [address]  [place], [place], [place] Phone: [phone], Fax: [phone]. Hours of Operation:  8:30 am - 5:30 pm, M-F.  Also accepts Medicaid and self-pay.  [facility] [place] [address], [place] Phone: [phone]   [place], [place], [place] [phone], Ext. [phone] Mondays & Thursdays: 7-9 AM.  First 15 patients are seen on a first come, first serve basis.    [place] Providers:  Organization         Address  Phone   Notes  [facility] [address], [place] [phone] Also  accepts self-pay patients.  [facility] [address], [place]  [phone]   [place], [address], [place] [phone]   [facility] [address] [place] [phone]   Lucianne Lei [address], [place]   [phone] Only accepts [place] Access [place] patients after they have their name applied to their card.   Self-Pay (no insurance) in [facility]:  Organization         Address  Phone   Notes  Sickle Cell Patients, [facility] [place] [phone]   [facility] [place] [phone]   [facility] [place]  [place], [place],  [phone]   [facility]/Dr. Osei-Bonsu  [address] [place] or [address], [place] [phone] Phone number for both [place] and [place] locations is the same.  Urgent Medical and [facility] [address], [place] [phone]   [facility] [address] [place] or [address] [phone]   [facility] [address], [place] [phone], phone; [phone], fax Sees patients 1st and 3rd Saturday of every month.  Must not qualify for public or private insurance (i.e. Medicaid, Medicare, [place] Health Choice, Veterans' Benefits)  Household income should be no more than 200% of the poverty level The clinic cannot treat you if you are pregnant or think you are pregnant  Sexually transmitted diseases are not treated at the clinic.    Dental Care: Organization         Address  Phone  Notes  [facility] Department of [facility] [place] [phone] Accepts children up to age 34 who are enrolled in [place] or [place]; pregnant  women with a Medicaid card; and children who have applied for Medicaid or [place] Health Choice, but were declined, whose parents can pay a reduced fee at time of service.  [facility] Department of [facility]  [address], [place] [phone] Accepts children up to age 24 who are enrolled in [place] or [place]; pregnant women with a Medicaid card; and children who have applied for Medicaid or [place] Health Choice, but were declined, whose parents can pay a reduced fee at time of service.  [facility] Adult Dental Access PROGRAM  [place] [phone] Patients are seen by appointment only. Walk-ins are not accepted. [place] will see patients 57 years of age and older. Monday - Tuesday (8am-5pm) Most Wednesdays (8:30-5pm) $30 per visit, cash only  [place]  [address], [place] [phone] Patients are seen by appointment only. Walk-ins are not accepted. [place] will see patients 47 years of age and older. One Wednesday Evening (Monthly: Volunteer Based).  $30 per visit, cash only  [place]  [phone] for adults; Children under age 20, call Graduate Pediatric Dentistry at [phone]. Children aged 75-14, please call [phone] to request a pediatric application.  Dental services are provided in all areas of dental care including fillings, crowns and bridges, complete and partial dentures, implants, gum treatment, root canals, and extractions. Preventive care is also provided. Treatment is provided to both adults and children. Patients are selected via a lottery and there is often a waiting list.   [facility] [address], [place]  [phone] [email or web address]   [facility] [address] [place], [place] [phone], Ext. [phone] Second and Fourth Thursday of each month, opens at 6:30 AM; Clinic ends at 9 AM.  Patients are  seen on a first-come first-served basis, and a limited number are seen during each clinic.   [facility]  [address] Hillard Danker [place], [place] [phone]   Eligibility Requirements You must have lived in [place], [place], or [place] counties for at least the last three months.   You cannot be eligible for state or federal sponsored [employer], including [employer], [place], or [employer].   You generally cannot be eligible for healthcare insurance through your employer.    How to apply: Eligibility screenings are held every Tuesday and Wednesday afternoon from 1:00 pm until 4:00 pm. You do not need an appointment for the interview!  [facility] [place] [address] [place], [place]   [place]  [place] Department  [place]  [phone]    Behavioral Health Resources in the Community: Intensive Outpatient Programs Organization         Address  Phone  Notes  [place]. [address] [place], [place] [phone]   [facility] [address] [place], [place]   ADS: Alcohol [facility] Drug Svcs [address] [place], [place]   [place] [address]  [place], [place] or [phone]   Substance Abuse Resources Organization         Address  Phone  Notes  Alcohol and Drug Services  [phone]   [place]  [phone]   [facility] [place]   Chinita Pester  [phone]   Residential & Outpatient Substance Abuse Program  [phone]   Psychological Services Organization         Address  Phone  Notes  [facility] [place]  [place]  [phone]   [place] [address] [place] or [phone]    Mobile Crisis  Teams Organization         Address  Phone  Notes  [facility]atives, Mobile Crisis Care Unit  [phone]   [facility]  [address] [place], [place]   [place] [address] [place] [phone]    Self-Help/Support Groups Organization         Address  Phone             Notes  [place] of [place] -  variety of support groups  [phone]- [phone] Call for more information  Narcotics Anonymous (NA), [facility] [address], [employer] [place]  2 meetings at this location   Residential Facilities manager         Address  Phone  Notes  [facility] [place],    [place]  [phone]   [facility]  [address] [place] 623762, [place], [place]   [place], [place] [phone] Admissions: 8am-3pm M-F  Incentives Substance [place] [address],    [place], [place] [phone]   [facility] [address] [place], [place], [place]   [facility] [address]  [place], [place]   Insight Programs - Intensive Outpatient [address] Kristeen [address], [place], [place]   [facility] [place] ([place].,  [place], [place] [phone] or [phone]   [facility]idential Treatment Services (RTS) [address] [place], [place] Accepts Medicaid  [facility] [place] [address],  [place] [phone] Substance Abuse/Addiction Treatment   [facility] Organization         Address  Phone  Notes  [facility]  [phone]   Domenic Schwab, PhD [address] [place], [place]   [phone] or [phone]   [place], [place] [phone]   [facility] [address] [place], [place] [phone]  Insurance/Medicaid/sponsorship through [facility] and Families [address]                                    [place], [place] [phone] [place] [address], [place] [phone]    Dr. Adele Schilder  [phone]   [facility] Dept. 1) [address] [place] 2) [place] 3)  [place] [address], [place] [phone]  [phone]   [place] [phone] or [phone] (After Hours)

## 2013-08-04 NOTE — ED Notes (Signed)
Pt reports breaking her Right lower tooth yesterday

## 2013-08-04 NOTE — ED Provider Notes (Signed)
Medical screening examination/treatment/procedure(s) were performed by non-physician practitioner and as supervising physician I was immediately available for consultation/collaboration.  EKG Interpretation   None        Courtney F Horton, MD 08/04/13 1200 

## 2013-08-05 ENCOUNTER — Ambulatory Visit (INDEPENDENT_AMBULATORY_CARE_PROVIDER_SITE_OTHER): Payer: Medicare Other

## 2013-08-05 VITALS — BP 135/80 | HR 77 | Temp 97.8°F | Ht 61.0 in | Wt 177.9 lb

## 2013-08-05 DIAGNOSIS — N949 Unspecified condition associated with female genital organs and menstrual cycle: Secondary | ICD-10-CM

## 2013-08-05 DIAGNOSIS — D259 Leiomyoma of uterus, unspecified: Secondary | ICD-10-CM

## 2013-08-05 DIAGNOSIS — D219 Benign neoplasm of connective and other soft tissue, unspecified: Secondary | ICD-10-CM

## 2013-08-05 DIAGNOSIS — R102 Pelvic and perineal pain: Secondary | ICD-10-CM

## 2013-08-05 MED ORDER — LEUPROLIDE ACETATE (3 MONTH) 11.25 MG IM KIT
11.2500 mg | PACK | Freq: Once | INTRAMUSCULAR | Status: AC
  Administered 2013-08-05: 11.25 mg via INTRAMUSCULAR

## 2013-08-05 NOTE — Progress Notes (Signed)
Pt. Here today for depo lupron injection. Injection given. Pt. Tolerated well. Pt. To return between 4/15-4/29 for another dose.

## 2013-08-06 ENCOUNTER — Ambulatory Visit: Payer: Medicare Other

## 2013-08-24 ENCOUNTER — Ambulatory Visit (HOSPITAL_COMMUNITY): Payer: Medicare Other

## 2013-10-21 ENCOUNTER — Ambulatory Visit (INDEPENDENT_AMBULATORY_CARE_PROVIDER_SITE_OTHER): Payer: Medicare Other | Admitting: *Deleted

## 2013-10-21 VITALS — BP 145/95 | HR 85 | Temp 97.2°F | Wt 177.8 lb

## 2013-10-21 DIAGNOSIS — R102 Pelvic and perineal pain: Secondary | ICD-10-CM

## 2013-10-21 DIAGNOSIS — D219 Benign neoplasm of connective and other soft tissue, unspecified: Secondary | ICD-10-CM

## 2013-10-21 DIAGNOSIS — D259 Leiomyoma of uterus, unspecified: Secondary | ICD-10-CM

## 2013-10-21 MED ORDER — LEUPROLIDE ACETATE (3 MONTH) 11.25 MG IM KIT
11.2500 mg | PACK | Freq: Once | INTRAMUSCULAR | Status: AC
  Administered 2013-10-21: 11.25 mg via INTRAMUSCULAR

## 2013-10-21 NOTE — Progress Notes (Signed)
Per chart review noted Kristin Bond has had 10 Lupron injections, called Dr. Roselie Awkward to discuss if we should continue.  Plan is to ask patient if Lupron continues to help her pain and if so may have Lupron today, then plan to make follow up appointment with Dr. Hulan Fray or Dr. Roselie Awkward in 3 months to discuss treatment options/ follow up fibroids/ pelvic pain.    Kristin Bond denies any pain and states Lupron and estradiol working well for her.  Explained plan to her.  Shot given.

## 2014-01-15 ENCOUNTER — Ambulatory Visit: Payer: Medicare Other | Admitting: Obstetrics & Gynecology

## 2014-02-22 ENCOUNTER — Ambulatory Visit: Payer: Medicare Other | Admitting: Obstetrics & Gynecology

## 2014-02-22 ENCOUNTER — Telehealth: Payer: Self-pay

## 2014-02-22 NOTE — Telephone Encounter (Signed)
Patient missed today's appointment. Called patient who stated she had family in town but would like to reschedule. Informed patient it may not be until end of September- beginning of October. Patient verbalized understanding and stated "that will be fine." Message sent to front office staff to call patient with rescheduled appointment.

## 2014-03-16 ENCOUNTER — Telehealth: Payer: Self-pay | Admitting: *Deleted

## 2014-03-16 NOTE — Telephone Encounter (Signed)
Patient called and left message that she was stopped on her Lupron shots and is starting to have cramps. Ibuprofen is not helping.

## 2014-03-17 ENCOUNTER — Encounter: Payer: Self-pay | Admitting: General Practice

## 2014-03-17 NOTE — Telephone Encounter (Signed)
Called patient, no answer- left message that we are trying to return your phone call, please call us back at the clinics

## 2014-03-17 NOTE — Telephone Encounter (Signed)
Pt called back @ 1546 stating that she missed our call. She would like to speak with a nurse today. I called her and left a message requesting her to call back with detailed information regarding her pain, what medication she is taking and the dosage. We will call her back tomorrow after discussing with the on-call doctor.

## 2014-03-18 NOTE — Telephone Encounter (Signed)
Called patient, no answer- left message that we are trying to reach you to return your phone call, if you still need assistance please call us back at the clinics

## 2014-03-19 ENCOUNTER — Telehealth: Payer: Self-pay | Admitting: General Practice

## 2014-03-19 NOTE — Telephone Encounter (Signed)
Patient called and left message stating she would like a call back, she missed our call again and would like a callback early in the morning. Called patient back and she states she was on the lupron shots but we have stopped them and she is currently hurting in her private parts and wants to know if there is a sooner appt than October. Told patient we do not have any sooner appts at this time however we will add her to our cancellation list and if something comes up & someone cancels we will call her with a sooner appt and that she is welcome to call us as well to check for cancellations each day. Also discussed with patient that should her pain become severe she should go to the ER for evaluation. Patient verbalized understanding and had no other questions

## 2014-03-31 ENCOUNTER — Ambulatory Visit: Payer: Medicare Other | Admitting: Obstetrics and Gynecology

## 2014-04-12 ENCOUNTER — Ambulatory Visit: Payer: Medicare Other | Admitting: Obstetrics & Gynecology

## 2014-04-15 ENCOUNTER — Encounter: Payer: Self-pay | Admitting: Obstetrics & Gynecology

## 2014-04-15 ENCOUNTER — Ambulatory Visit (INDEPENDENT_AMBULATORY_CARE_PROVIDER_SITE_OTHER): Payer: Medicare Other | Admitting: Obstetrics & Gynecology

## 2014-04-15 VITALS — BP 154/86 | HR 83 | Temp 98.5°F | Wt 168.7 lb

## 2014-04-15 DIAGNOSIS — R102 Pelvic and perineal pain: Secondary | ICD-10-CM

## 2014-04-15 DIAGNOSIS — D259 Leiomyoma of uterus, unspecified: Secondary | ICD-10-CM

## 2014-04-15 DIAGNOSIS — Z1231 Encounter for screening mammogram for malignant neoplasm of breast: Secondary | ICD-10-CM

## 2014-04-15 MED ORDER — TRAMADOL HCL 50 MG PO TABS: 50.0000 mg | ORAL_TABLET | Freq: Four times a day (QID) | ORAL | Status: DC | PRN

## 2014-04-15 NOTE — Patient Instructions (Signed)
Return to clinic for any scheduled appointments or for any gynecologic concerns as needed.   

## 2014-04-15 NOTE — Progress Notes (Signed)
   CLINIC ENCOUNTER NOTE  History:  48 y.o. G0P0000 here today for discussion about Depo Lupron for her fibroids.  Still having occasional cramping, none currently.  Cramping is not alleviated with NSAIDs. No bleeding.    The following portions of the patient's history were reviewed and updated as appropriate: allergies, current medications, past family history, past medical history, past social history, past surgical history and problem list. Normal pap and negative HRHPV on 08/04/12.  Normal mammogram on 08/25/12.   Review of Systems:  Pertinent items are noted in HPI.  Objective:  BP 154/86  Pulse 83  Temp(Src) 98.5 F (36.9 C)  Wt 168 lb 11.2 oz (76.522 kg) Physical Exam deferred  Assessment & Plan:  Will get pelvic ultrasound to evaluate uterus. Mammogram also ordered.  Ultram prescribed, will continue to observe.  Continue Estradiol for vasomotor symptoms, no need to restart Depo Lupron.  Routine preventative health maintenance measures emphasized.   Verita Schneiders, MD, [place] Attending [place] for [employer], [place]

## 2014-04-20 ENCOUNTER — Other Ambulatory Visit (HOSPITAL_COMMUNITY): Payer: Medicare Other

## 2014-04-21 ENCOUNTER — Ambulatory Visit: Payer: Medicare Other | Admitting: Obstetrics & Gynecology

## 2014-04-22 ENCOUNTER — Ambulatory Visit (HOSPITAL_COMMUNITY): Admission: RE | Admit: 2014-04-22 | Payer: Medicare Other | Source: Ambulatory Visit

## 2014-04-22 ENCOUNTER — Ambulatory Visit (HOSPITAL_COMMUNITY): Payer: Medicare Other

## 2014-05-13 ENCOUNTER — Ambulatory Visit (HOSPITAL_COMMUNITY): Payer: Medicare Other

## 2014-05-13 ENCOUNTER — Ambulatory Visit (HOSPITAL_COMMUNITY): Admission: RE | Admit: 2014-05-13 | Payer: Medicare Other | Source: Ambulatory Visit

## 2014-05-17 ENCOUNTER — Ambulatory Visit (HOSPITAL_COMMUNITY): Payer: Medicare Other

## 2014-05-17 ENCOUNTER — Ambulatory Visit (HOSPITAL_COMMUNITY): Admission: RE | Admit: 2014-05-17 | Payer: Medicare Other | Source: Ambulatory Visit

## 2014-06-10 ENCOUNTER — Telehealth: Payer: Self-pay | Admitting: *Deleted

## 2014-06-10 DIAGNOSIS — D259 Leiomyoma of uterus, unspecified: Secondary | ICD-10-CM

## 2014-06-10 DIAGNOSIS — R102 Pelvic and perineal pain: Secondary | ICD-10-CM

## 2014-06-10 MED ORDER — TRAMADOL HCL 50 MG PO TABS: 50.0000 mg | ORAL_TABLET | Freq: Four times a day (QID) | ORAL | Status: DC | PRN

## 2014-06-10 NOTE — Telephone Encounter (Signed)
Pt left message requesting refill of pain medication for her fibroids. Per chart review, pt has cancelled and/or re-scheduled Pelvic/Trans vag [place] appt multiple times since last visit on 04/15/14.  I called pt and discussed her request for Tramadol and the appt needed for [place]. She agreed to [place] appt on 12/14 @ 1015. I advised her that I will send a message to Dr. Harolyn Rutherford regarding her refill request. Pt voiced understanding.

## 2014-06-10 NOTE — Telephone Encounter (Signed)
Tramadol refilled.  Patient needs to get ultrasound before any further refills are given.

## 2014-06-11 NOTE — Telephone Encounter (Signed)
Tramadol Rx refill called to [facility]. I called pt and advised her that her refill has been granted and called to [facility]. She will need to have the pelvic ultrasound before any additional refills will be considered. Pt confirmed [place] appt details and voiced understanding.

## 2014-06-21 ENCOUNTER — Ambulatory Visit (HOSPITAL_COMMUNITY)
Admission: RE | Admit: 2014-06-21 | Discharge: 2014-06-21 | Disposition: A | Discharge status: Home / Self Care | Payer: Medicare Other | Source: Ambulatory Visit | Attending: Obstetrics & Gynecology | Admitting: Obstetrics & Gynecology

## 2014-06-21 ENCOUNTER — Ambulatory Visit (HOSPITAL_COMMUNITY)
Admission: RE | Admit: 2014-06-21 | Discharge: 2014-06-21 | Disposition: A | Discharge status: Home / Self Care | Payer: Medicare Other | Source: Ambulatory Visit | Attending: Internal Medicine | Admitting: Internal Medicine

## 2014-06-21 DIAGNOSIS — R102 Pelvic and perineal pain: Secondary | ICD-10-CM

## 2014-06-21 DIAGNOSIS — N949 Unspecified condition associated with female genital organs and menstrual cycle: Secondary | ICD-10-CM | POA: Insufficient documentation

## 2014-06-21 DIAGNOSIS — Z1231 Encounter for screening mammogram for malignant neoplasm of breast: Secondary | ICD-10-CM | POA: Diagnosis not present

## 2014-06-21 DIAGNOSIS — D259 Leiomyoma of uterus, unspecified: Secondary | ICD-10-CM

## 2014-06-21 DIAGNOSIS — D252 Subserosal leiomyoma of uterus: Secondary | ICD-10-CM | POA: Insufficient documentation

## 2014-06-22 ENCOUNTER — Telehealth: Payer: Self-pay

## 2014-06-22 NOTE — Telephone Encounter (Signed)
-----   Message from Osborne O[place], MD sent at 06/21/2014  1:51 PM EST ----- Small uterus and small fibroids (biggest 2.6 cm).  Can continue Lupron for now.  No need for urgent surgical intervention. Please call to inform patient of results and recommendations.

## 2014-06-22 NOTE — Telephone Encounter (Signed)
Called patient and informed her of results and recommendations. Patient would like to come in for lupron-- last injection was 10/2013. Patient states she would like to come in Monday 06/28/14 at 0930. Message sent to admin pool to add patient to nurse to schedule.

## 2014-06-28 ENCOUNTER — Ambulatory Visit: Payer: Medicare Other

## 2014-06-30 ENCOUNTER — Ambulatory Visit (INDEPENDENT_AMBULATORY_CARE_PROVIDER_SITE_OTHER): Payer: Medicare Other | Admitting: *Deleted

## 2014-06-30 VITALS — BP 138/93 | HR 83

## 2014-06-30 DIAGNOSIS — R102 Pelvic and perineal pain: Secondary | ICD-10-CM | POA: Diagnosis not present

## 2014-06-30 DIAGNOSIS — D259 Leiomyoma of uterus, unspecified: Secondary | ICD-10-CM | POA: Diagnosis present

## 2014-06-30 MED ORDER — TRAMADOL HCL 50 MG PO TABS: 50.0000 mg | ORAL_TABLET | Freq: Four times a day (QID) | ORAL | Status: DC | PRN

## 2014-06-30 MED ORDER — LEUPROLIDE ACETATE (3 MONTH) 11.25 MG IM KIT
11.2500 mg | PACK | Freq: Once | INTRAMUSCULAR | Status: AC
  Administered 2014-06-30: 11.25 mg via INTRAMUSCULAR

## 2014-06-30 NOTE — Progress Notes (Signed)
Here for Lupron . Also asks for refill of tramadol. Called Dr. Kennon Rounds to discuss after reviewing chart. Refill approved

## 2014-08-25 ENCOUNTER — Telehealth: Payer: Self-pay

## 2014-08-25 DIAGNOSIS — D259 Leiomyoma of uterus, unspecified: Secondary | ICD-10-CM

## 2014-08-25 MED ORDER — TRAMADOL HCL 50 MG PO TABS: 50.0000 mg | ORAL_TABLET | Freq: Four times a day (QID) | ORAL | Status: DC | PRN

## 2014-08-25 MED ORDER — MEDROXYPROGESTERONE ACETATE 10 MG PO TABS: 10.0000 mg | ORAL_TABLET | Freq: Every day | ORAL | Status: DC

## 2014-08-25 MED ORDER — ESTRADIOL 1 MG PO TABS: 1.0000 mg | ORAL_TABLET | Freq: Every day | ORAL | Status: DC

## 2014-08-25 NOTE — Telephone Encounter (Signed)
Patient called requesting refill of Tramadol and medication for hot flashes. Dr. Harolyn Rutherford reviewed chart and approved refill tramadol with 2 refills. Also agreed to prescribe estradiol 1mg  #30 with 12 refills and provera 10mg  #30 with 12 refills. Patient informed. No further questions or concerns.

## 2014-09-17 ENCOUNTER — Ambulatory Visit: Payer: Medicare Other

## 2014-09-20 ENCOUNTER — Telehealth: Payer: Self-pay | Admitting: General Practice

## 2014-09-20 NOTE — Telephone Encounter (Signed)
Patient called and left message stating she is calling about her pain medication and uses [facility] on [address]. Called patient stating I am returning her phone call. Patient states she needs a refill on her tramadol. Asked patient if she has already used her two refills when she was given the medication in February. Patient states oh I didn't know I had those and states she will call her pharmacy. Patient had no other questions

## 2014-09-29 ENCOUNTER — Other Ambulatory Visit: Payer: Self-pay | Admitting: Gastroenterology

## 2014-09-29 DIAGNOSIS — R1013 Epigastric pain: Secondary | ICD-10-CM

## 2014-10-04 ENCOUNTER — Ambulatory Visit: Payer: Medicare Other

## 2014-10-07 ENCOUNTER — Encounter (HOSPITAL_COMMUNITY): Payer: Self-pay | Admitting: Emergency Medicine

## 2014-10-07 ENCOUNTER — Emergency Department (HOSPITAL_COMMUNITY): Payer: Medicare Other

## 2014-10-07 ENCOUNTER — Other Ambulatory Visit: Payer: Medicare Other

## 2014-10-07 ENCOUNTER — Ambulatory Visit: Payer: Medicare Other

## 2014-10-07 ENCOUNTER — Emergency Department (HOSPITAL_COMMUNITY)
Admission: EM | Admit: 2014-10-07 | Discharge: 2014-10-07 | Disposition: A | Discharge status: Home / Self Care | Payer: Medicare Other | Attending: Emergency Medicine | Admitting: Emergency Medicine

## 2014-10-07 DIAGNOSIS — Z793 Long term (current) use of hormonal contraceptives: Secondary | ICD-10-CM | POA: Insufficient documentation

## 2014-10-07 DIAGNOSIS — Z720 Tobacco use: Secondary | ICD-10-CM | POA: Diagnosis not present

## 2014-10-07 DIAGNOSIS — R110 Nausea: Secondary | ICD-10-CM | POA: Insufficient documentation

## 2014-10-07 DIAGNOSIS — J45901 Unspecified asthma with (acute) exacerbation: Secondary | ICD-10-CM | POA: Insufficient documentation

## 2014-10-07 DIAGNOSIS — R079 Chest pain, unspecified: Secondary | ICD-10-CM | POA: Diagnosis present

## 2014-10-07 DIAGNOSIS — R1013 Epigastric pain: Secondary | ICD-10-CM | POA: Insufficient documentation

## 2014-10-07 DIAGNOSIS — Z8742 Personal history of other diseases of the female genital tract: Secondary | ICD-10-CM | POA: Diagnosis not present

## 2014-10-07 DIAGNOSIS — Z791 Long term (current) use of non-steroidal anti-inflammatories (NSAID): Secondary | ICD-10-CM | POA: Diagnosis not present

## 2014-10-07 DIAGNOSIS — I10 Essential (primary) hypertension: Secondary | ICD-10-CM | POA: Diagnosis not present

## 2014-10-07 DIAGNOSIS — F319 Bipolar disorder, unspecified: Secondary | ICD-10-CM | POA: Insufficient documentation

## 2014-10-07 DIAGNOSIS — Z79899 Other long term (current) drug therapy: Secondary | ICD-10-CM | POA: Diagnosis not present

## 2014-10-07 LAB — COMPREHENSIVE METABOLIC PANEL
ALT: 18 U/L (ref 0–35)
AST: 18 U/L (ref 0–37)
Albumin: 3.6 g/dL (ref 3.5–5.2)
Alkaline Phosphatase: 70 U/L (ref 39–117)
Anion gap: 8 (ref 5–15)
BILIRUBIN TOTAL: 0.7 mg/dL (ref 0.3–1.2)
BUN: 10 mg/dL (ref 6–23)
CALCIUM: 9 mg/dL (ref 8.4–10.5)
CHLORIDE: 106 mmol/L (ref 96–112)
CO2: 24 mmol/L (ref 19–32)
Creatinine, Ser: 0.79 mg/dL (ref 0.50–1.10)
GFR calc Af Amer: >90 mL/min (ref >90)
Glucose, Bld: 114 mg/dL — ABNORMAL HIGH (ref 70–99)
Potassium: 3.6 mmol/L (ref 3.5–5.1)
Sodium: 138 mmol/L (ref 135–145)
Total Protein: 7.7 g/dL (ref 6.0–8.3)

## 2014-10-07 LAB — I-STAT TROPONIN, ED
TROPONIN I, POC: 0 ng/mL (ref 0.00–0.08)
Troponin i, poc: 0 ng/mL (ref 0.00–0.08)

## 2014-10-07 LAB — CBC
HCT: 43.1 % (ref 36.0–46.0)
Hemoglobin: 13.7 g/dL (ref 12.0–15.0)
MCH: 28.7 pg (ref 26.0–34.0)
MCHC: 31.8 g/dL (ref 30.0–36.0)
MCV: 90.4 fL (ref 78.0–100.0)
Platelets: 211 10*3/uL (ref 150–400)
RBC: 4.77 MIL/uL (ref 3.87–5.11)
RDW: 15.5 % (ref 11.5–15.5)
WBC: 6.1 10*3/uL (ref 4.0–10.5)

## 2014-10-07 LAB — LIPASE, BLOOD: Lipase: 30 U/L (ref 11–59)

## 2014-10-07 MED ORDER — GI COCKTAIL ~~LOC~~
30.0000 mL | Freq: Once | ORAL | Status: AC
  Administered 2014-10-07: 30 mL via ORAL
  Filled 2014-10-07: qty 30

## 2014-10-07 NOTE — ED Notes (Signed)
Pt requesting that she speak to PCP that is currently at the hospital. Dr. Mingo Amber notified, he is to speak with patient.

## 2014-10-07 NOTE — ED Notes (Signed)
Pt transported to xray 

## 2014-10-07 NOTE — ED Notes (Signed)
Lab at bedside

## 2014-10-07 NOTE — ED Notes (Signed)
C/o midsternal chest pain started last night, "feels like pressure with left arm numbness" has been treated with carafate for GI problems-- no BP meds since Friday-- states has been constipated for 3 days-- had BM this morning.

## 2014-10-07 NOTE — ED Notes (Signed)
Pt reports 7/10 midsternal chest pressure radiating to L arm. Ongoing for several days. Reports she stopped taking BP medications last week because she thought she was having an allergic reaction to her medications. Also states she has been taking carafate for GI issues. 7/10 pain upon arrival to exam room. Pt is alert and oriented x4.

## 2014-10-07 NOTE — Discharge Instructions (Signed)
Abdominal Pain, Women °Abdominal (stomach, pelvic, or belly) pain can be caused by many things. It is important to tell your doctor: °· The location of the pain. °· Does it come and go or is it present all the time? °· Are there things that start the pain (eating certain foods, exercise)? °· Are there other symptoms associated with the pain (fever, nausea, vomiting, diarrhea)? °All of this is helpful to know when trying to find the cause of the pain. °CAUSES  °· Stomach: virus or bacteria infection, or ulcer. °· Intestine: appendicitis (inflamed appendix), regional ileitis (Crohn's disease), ulcerative colitis (inflamed colon), irritable bowel syndrome, diverticulitis (inflamed diverticulum of the colon), or cancer of the stomach or intestine. °· Gallbladder disease or stones in the gallbladder. °· Kidney disease, kidney stones, or infection. °· Pancreas infection or cancer. °· Fibromyalgia (pain disorder). °· Diseases of the female organs: °¨ Uterus: fibroid (non-cancerous) tumors or infection. °¨ Fallopian tubes: infection or tubal pregnancy. °¨ Ovary: cysts or tumors. °¨ Pelvic adhesions (scar tissue). °¨ Endometriosis (uterus lining tissue growing in the pelvis and on the pelvic organs). °¨ Pelvic congestion syndrome (female organs filling up with blood just before the menstrual period). °¨ Pain with the menstrual period. °¨ Pain with ovulation (producing an egg). °¨ Pain with an IUD (intrauterine device, birth control) in the uterus. °¨ Cancer of the female organs. °· Functional pain (pain not caused by a disease, may improve without treatment). °· Psychological pain. °· Depression. °DIAGNOSIS  °Your doctor will decide the seriousness of your pain by doing an examination. °· Blood tests. °· X-rays. °· Ultrasound. °· CT scan (computed tomography, special type of X-ray). °· MRI (magnetic resonance imaging). °· Cultures, for infection. °· Barium enema (dye inserted in the large intestine, to better view it with  X-rays). °· Colonoscopy (looking in intestine with a lighted tube). °· Laparoscopy (minor surgery, looking in abdomen with a lighted tube). °· Major abdominal exploratory surgery (looking in abdomen with a large incision). °TREATMENT  °The treatment will depend on the cause of the pain.  °· Many cases can be observed and treated at home. °· Over-the-counter medicines recommended by your caregiver. °· Prescription medicine. °· Antibiotics, for infection. °· Birth control pills, for painful periods or for ovulation pain. °· Hormone treatment, for endometriosis. °· Nerve blocking injections. °· Physical therapy. °· Antidepressants. °· Counseling with a psychologist or psychiatrist. °· Minor or major surgery. °HOME CARE INSTRUCTIONS  °· Do not take laxatives, unless directed by your caregiver. °· Take over-the-counter pain medicine only if ordered by your caregiver. Do not take aspirin because it can cause an upset stomach or bleeding. °· Try a clear liquid diet (broth or water) as ordered by your caregiver. Slowly move to a bland diet, as tolerated, if the pain is related to the stomach or intestine. °· Have a thermometer and take your temperature several times a day, and record it. °· Bed rest and sleep, if it helps the pain. °· Avoid sexual intercourse, if it causes pain. °· Avoid stressful situations. °· Keep your follow-up appointments and tests, as your caregiver orders. °· If the pain does not go away with medicine or surgery, you may try: °¨ Acupuncture. °¨ Relaxation exercises (yoga, meditation). °¨ Group therapy. °¨ Counseling. °SEEK MEDICAL CARE IF:  °· You notice certain foods cause stomach pain. °· Your home care treatment is not helping your pain. °· You need stronger pain medicine. °· You want your IUD removed. °· You feel faint or   lightheaded. °· You develop nausea and vomiting. °· You develop a rash. °· You are having side effects or an allergy to your medicine. °SEEK IMMEDIATE MEDICAL CARE IF:  °· Your  pain does not go away or gets worse. °· You have a fever. °· Your pain is felt only in portions of the abdomen. The right side could possibly be appendicitis. The left lower portion of the abdomen could be colitis or diverticulitis. °· You are passing blood in your stools (bright red or black tarry stools, with or without vomiting). °· You have blood in your urine. °· You develop chills, with or without a fever. °· You pass out. °MAKE SURE YOU:  °· Understand these instructions. °· Will watch your condition. °· Will get help right away if you are not doing well or get worse. °Document Released: 04/22/2007 Document Revised: 11/09/2013 Document Reviewed: 05/12/2009 °ExitCare® Patient Information ©2015 [employer]. This information is not intended to replace advice given to you by your health care provider. Make sure you discuss any questions you have with your health care provider. ° °
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
## 2014-10-11 ENCOUNTER — Ambulatory Visit (INDEPENDENT_AMBULATORY_CARE_PROVIDER_SITE_OTHER): Payer: Medicare Other

## 2014-10-11 DIAGNOSIS — Z01812 Encounter for preprocedural laboratory examination: Secondary | ICD-10-CM

## 2014-10-11 DIAGNOSIS — Z3202 Encounter for pregnancy test, result negative: Secondary | ICD-10-CM

## 2014-10-11 DIAGNOSIS — D259 Leiomyoma of uterus, unspecified: Secondary | ICD-10-CM

## 2014-10-11 LAB — POCT PREGNANCY, URINE: Preg Test, Ur: NEGATIVE

## 2014-10-11 MED ORDER — LEUPROLIDE ACETATE (3 MONTH) 11.25 MG IM KIT
11.2500 mg | PACK | Freq: Once | INTRAMUSCULAR | Status: AC
  Administered 2014-10-11: 11.25 mg via INTRAMUSCULAR

## 2014-10-11 NOTE — Progress Notes (Signed)
Patient here today for depo lupron-- late for injection. UPT obtained and negative. Depo lupron administered into LUO quadrant of buttocks. Patient tolerated well. To return between 12/27/14 and 01/10/15.

## 2014-10-14 ENCOUNTER — Ambulatory Visit: Payer: Medicare Other | Admitting: Interventional Cardiology

## 2014-10-22 ENCOUNTER — Ambulatory Visit
Admission: RE | Admit: 2014-10-22 | Discharge: 2014-10-22 | Disposition: A | Discharge status: Home / Self Care | Payer: Medicare Other | Source: Ambulatory Visit | Attending: Gastroenterology | Admitting: Gastroenterology

## 2014-10-22 DIAGNOSIS — R1013 Epigastric pain: Secondary | ICD-10-CM

## 2014-12-15 ENCOUNTER — Telehealth: Payer: Self-pay | Admitting: *Deleted

## 2014-12-15 NOTE — Telephone Encounter (Signed)
Pt left message requesting Rx for pain medication to be sent to pharmacy on [address]. She did not state the name of the pharmacy. I called [facility] on [address] and learned that pt has 1 refill of Tramadol 50 mg #30 remaining. I then called pt and advised her of this information. She may have the refill transferred to the pharmacy of her choice if desired. Pt voiced understanding.

## 2014-12-29 ENCOUNTER — Ambulatory Visit: Payer: Medicare Other

## 2015-01-05 ENCOUNTER — Ambulatory Visit (INDEPENDENT_AMBULATORY_CARE_PROVIDER_SITE_OTHER): Payer: Medicare Other

## 2015-01-05 VITALS — BP 136/94 | HR 90 | Wt 174.4 lb

## 2015-01-05 DIAGNOSIS — D259 Leiomyoma of uterus, unspecified: Secondary | ICD-10-CM | POA: Diagnosis not present

## 2015-01-05 MED ORDER — LEUPROLIDE ACETATE (3 MONTH) 11.25 MG IM KIT
11.2500 mg | PACK | Freq: Once | INTRAMUSCULAR | Status: AC
  Administered 2015-01-05: 11.25 mg via INTRAMUSCULAR

## 2015-03-23 ENCOUNTER — Ambulatory Visit: Payer: Medicare Other

## 2015-04-13 ENCOUNTER — Ambulatory Visit: Payer: Medicare Other

## 2015-04-14 ENCOUNTER — Ambulatory Visit: Payer: Medicare Other

## 2015-04-18 ENCOUNTER — Ambulatory Visit: Payer: Medicare Other

## 2015-04-21 ENCOUNTER — Ambulatory Visit (INDEPENDENT_AMBULATORY_CARE_PROVIDER_SITE_OTHER): Payer: Medicare Other

## 2015-04-21 VITALS — BP 127/92 | HR 87 | Wt 174.9 lb

## 2015-04-21 DIAGNOSIS — N938 Other specified abnormal uterine and vaginal bleeding: Secondary | ICD-10-CM

## 2015-04-21 MED ORDER — LEUPROLIDE ACETATE (3 MONTH) 11.25 MG IM KIT
11.2500 mg | PACK | Freq: Once | INTRAMUSCULAR | Status: AC
  Administered 2015-04-21: 11.25 mg via INTRAMUSCULAR

## 2015-07-07 ENCOUNTER — Ambulatory Visit: Payer: Medicare Other

## 2015-07-11 ENCOUNTER — Ambulatory Visit: Payer: Medicare Other

## 2015-07-13 ENCOUNTER — Ambulatory Visit (INDEPENDENT_AMBULATORY_CARE_PROVIDER_SITE_OTHER): Payer: Medicare Other

## 2015-07-13 VITALS — BP 138/94 | HR 87

## 2015-07-13 DIAGNOSIS — D259 Leiomyoma of uterus, unspecified: Secondary | ICD-10-CM | POA: Diagnosis present

## 2015-07-13 MED ORDER — TRAMADOL HCL 50 MG PO TABS
50.0000 mg | ORAL_TABLET | Freq: Four times a day (QID) | ORAL | Status: DC | PRN
Start: 2015-07-13 — End: 2015-10-06

## 2015-07-13 MED ORDER — LEUPROLIDE ACETATE (3 MONTH) 11.25 MG IM KIT
11.2500 mg | PACK | Freq: Once | INTRAMUSCULAR | Status: AC
  Administered 2015-07-13: 11.25 mg via INTRAMUSCULAR

## 2015-08-11 ENCOUNTER — Telehealth: Payer: Self-pay | Admitting: General Practice

## 2015-08-11 NOTE — Telephone Encounter (Signed)
Patient called into front office stating she had problems getting in touch with her pharmacy but wants a refill on her tramadol sent to the [address] [facility]. Told patient we can take care of that for her. Patient verbalized understanding & had no questions

## 2015-08-17 ENCOUNTER — Emergency Department (HOSPITAL_COMMUNITY): Payer: Medicare Other

## 2015-08-17 ENCOUNTER — Encounter (HOSPITAL_COMMUNITY): Payer: Self-pay | Admitting: *Deleted

## 2015-08-17 ENCOUNTER — Emergency Department (HOSPITAL_COMMUNITY)
Admission: EM | Admit: 2015-08-17 | Discharge: 2015-08-17 | Disposition: A | Discharge status: Home / Self Care | Payer: Medicare Other | Attending: Emergency Medicine | Admitting: Emergency Medicine

## 2015-08-17 DIAGNOSIS — Z86018 Personal history of other benign neoplasm: Secondary | ICD-10-CM | POA: Diagnosis not present

## 2015-08-17 DIAGNOSIS — F17210 Nicotine dependence, cigarettes, uncomplicated: Secondary | ICD-10-CM | POA: Insufficient documentation

## 2015-08-17 DIAGNOSIS — J45901 Unspecified asthma with (acute) exacerbation: Secondary | ICD-10-CM | POA: Insufficient documentation

## 2015-08-17 DIAGNOSIS — I10 Essential (primary) hypertension: Secondary | ICD-10-CM | POA: Diagnosis not present

## 2015-08-17 DIAGNOSIS — Z79899 Other long term (current) drug therapy: Secondary | ICD-10-CM | POA: Insufficient documentation

## 2015-08-17 DIAGNOSIS — R079 Chest pain, unspecified: Secondary | ICD-10-CM | POA: Diagnosis present

## 2015-08-17 LAB — CBC
HEMATOCRIT: 44.4 % (ref 36.0–46.0)
Hemoglobin: 14.6 g/dL (ref 12.0–15.0)
MCH: 30.1 pg (ref 26.0–34.0)
MCHC: 32.9 g/dL (ref 30.0–36.0)
MCV: 91.5 fL (ref 78.0–100.0)
Platelets: 190 10*3/uL (ref 150–400)
RBC: 4.85 MIL/uL (ref 3.87–5.11)
RDW: 14.9 % (ref 11.5–15.5)
WBC: 7.7 10*3/uL (ref 4.0–10.5)

## 2015-08-17 LAB — BASIC METABOLIC PANEL
ANION GAP: 12 (ref 5–15)
BUN: 13 mg/dL (ref 6–20)
CO2: 23 mmol/L (ref 22–32)
Calcium: 9.2 mg/dL (ref 8.9–10.3)
Chloride: 103 mmol/L (ref 101–111)
Creatinine, Ser: 1.05 mg/dL — ABNORMAL HIGH (ref 0.44–1.00)
GFR calc Af Amer: >60 mL/min (ref >60)
GLUCOSE: 118 mg/dL — AB (ref 65–99)
Potassium: 3.7 mmol/L (ref 3.5–5.1)
Sodium: 138 mmol/L (ref 135–145)

## 2015-08-17 LAB — I-STAT TROPONIN, ED: Troponin i, poc: 0 ng/mL (ref 0.00–0.08)

## 2015-08-17 MED ORDER — PREDNISONE 10 MG PO TABS: 20.0000 mg | ORAL_TABLET | Freq: Every day | ORAL | Status: AC

## 2015-08-17 MED ORDER — ALBUTEROL SULFATE (2.5 MG/3ML) 0.083% IN NEBU
2.5000 mg | INHALATION_SOLUTION | Freq: Once | RESPIRATORY_TRACT | Status: AC
  Administered 2015-08-17: 2.5 mg via RESPIRATORY_TRACT
  Filled 2015-08-17: qty 3

## 2015-08-17 MED ORDER — AZITHROMYCIN 250 MG PO TABS: 250.0000 mg | ORAL_TABLET | Freq: Every day | ORAL | Status: DC

## 2015-08-17 NOTE — ED Notes (Signed)
Pt presents via POV c/o productive cough and chest pain since last PM.  Pt states all of a sudden last night she began coughing and having severe chest pain radiating to her back.  Denies sick contacts.  Hx: HTN.  Denies fever. Pt a x 4, NAD.

## 2015-08-17 NOTE — Discharge Instructions (Signed)
Asthma, Adult Asthma is a condition of the lungs in which the airways tighten and narrow. Asthma can make it hard to breathe. Asthma cannot be cured, but medicine and lifestyle changes can help control it. Asthma may be started (triggered) by:  Animal skin flakes (dander).  Dust.  Cockroaches.  Pollen.  Mold.  Smoke.  Cleaning products.  Hair sprays or aerosol sprays.  Paint fumes or strong smells.  Cold air, weather changes, and winds.  Crying or laughing hard.  Stress.  Certain medicines or drugs.  Foods, such as dried fruit, potato chips, and sparkling grape juice.  Infections or conditions (colds, flu).  Exercise.  Certain medical conditions or diseases.  Exercise or tiring activities. HOME CARE   Take medicine as told by your doctor.  Use a peak flow meter as told by your doctor. A peak flow meter is a tool that measures how well the lungs are working.  Record and keep track of the peak flow meter's readings.  Understand and use the asthma action plan. An asthma action plan is a written plan for taking care of your asthma and treating your attacks.  To help prevent asthma attacks:  Do not smoke. Stay away from secondhand smoke.  Change your heating and air conditioning filter often.  Limit your use of fireplaces and wood stoves.  Get rid of pests (such as roaches and mice) and their droppings.  Throw away plants if you see mold on them.  Clean your floors. Dust regularly. Use cleaning products that do not smell.  Have someone vacuum when you are not home. Use a vacuum cleaner with a HEPA filter if possible.  Replace carpet with wood, tile, or vinyl flooring. Carpet can trap animal skin flakes and dust.  Use allergy-proof pillows, mattress covers, and box spring covers.  Wash bed sheets and blankets every week in hot water and dry them in a dryer.  Use blankets that are made of polyester or cotton.  Clean bathrooms and kitchens with bleach.  If possible, have someone repaint the walls in these rooms with mold-resistant paint. Keep out of the rooms that are being cleaned and painted.  Wash hands often. GET HELP IF:  You have make a whistling sound when breaking (wheeze), have shortness of breath, or have a cough even if taking medicine to prevent attacks.  The colored mucus you cough up (sputum) is thicker than usual.  The colored mucus you cough up changes from clear or white to yellow, green, gray, or bloody.  You have problems from the medicine you are taking such as:  A rash.  Itching.  Swelling.  Trouble breathing.  You need reliever medicines more than 2-3 times a week.  Your peak flow measurement is still at 50-79% of your personal best after following the action plan for 1 hour.  You have a fever. GET HELP RIGHT AWAY IF:   You seem to be worse and are not responding to medicine during an asthma attack.  You are short of breath even at rest.  You get short of breath when doing very little activity.  You have trouble eating, drinking, or talking.  You have chest pain.  You have a fast heartbeat.  Your lips or fingernails start to turn blue.  You are light-headed, dizzy, or faint.  Your peak flow is less than 50% of your personal best.   This information is not intended to replace advice given to you by your health care provider. Make sure   you discuss any questions you have with your health care provider.   Document Released: 12/12/2007 Document Revised: 03/16/2015 Document Reviewed: 01/22/2013 Elsevier Interactive Patient Education 2016 [employer]  
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
## 2015-09-28 ENCOUNTER — Ambulatory Visit: Payer: Medicare Other

## 2015-10-05 ENCOUNTER — Ambulatory Visit: Payer: Medicare Other

## 2015-10-05 ENCOUNTER — Telehealth: Payer: Self-pay | Admitting: Family Medicine

## 2015-10-05 NOTE — Telephone Encounter (Signed)
Patient requesting refill on medication and wants nurse to call her back if possible today.

## 2015-10-05 NOTE — Telephone Encounter (Signed)
Patient returned call to clinic. Stated she needed tramadol refilled, which she mentioned is for her thyroid.

## 2015-10-05 NOTE — Telephone Encounter (Signed)
Called Kirstin and left a message I am returning her call, please call clinic and leave detailed message what medicine you needed refilled and what is going on.

## 2015-10-05 NOTE — Telephone Encounter (Signed)
Patient also mentioned on last call, she wants Rx transferred to [facility] aid on [facility].

## 2015-10-06 ENCOUNTER — Ambulatory Visit: Payer: Medicare Other

## 2015-10-06 ENCOUNTER — Ambulatory Visit (INDEPENDENT_AMBULATORY_CARE_PROVIDER_SITE_OTHER): Payer: Medicare Other | Admitting: General Practice

## 2015-10-06 VITALS — BP 154/91 | HR 83 | Temp 98.1°F | Ht 63.0 in | Wt 173.0 lb

## 2015-10-06 DIAGNOSIS — D259 Leiomyoma of uterus, unspecified: Secondary | ICD-10-CM | POA: Diagnosis not present

## 2015-10-06 MED ORDER — TRAMADOL HCL 50 MG PO TABS: 50.0000 mg | ORAL_TABLET | Freq: Four times a day (QID) | ORAL | Status: DC | PRN

## 2015-10-06 MED ORDER — LEUPROLIDE ACETATE (3 MONTH) 11.25 MG IM KIT
11.2500 mg | PACK | Freq: Once | INTRAMUSCULAR | Status: AC
  Administered 2015-10-06: 11.25 mg via INTRAMUSCULAR

## 2015-10-06 NOTE — Progress Notes (Signed)
Patient here today for lupron injection. Patient requesting tramadol refill. Patient states she has been on this medication for 2 years. Per chart review, patient last saw provider in 2014. Reviewed patient's chart with Dr Elly Modena who states patient can have two refills until next appt to see a provider. Patient will not get additional refills until she comes in to see a provider. Reviewed with patient and need to make follow up appt with provider. Patient aware no additional refills will be given until she sees a provider. Patient verbalized understanding & had no questions

## 2015-11-14 ENCOUNTER — Telehealth: Payer: Self-pay | Admitting: *Deleted

## 2015-11-14 NOTE — Telephone Encounter (Signed)
Kristin Bond called and left a message stating she wants a nurse to call her about her medicine.  Called Kristin Bond and she was stating she has an appt for her shot but nurse said something about Not getting pain med. Per chart review note states can not continue to get refill pain med until Sees a provider again- discussed with Kristin Bond. She would like to make an appt. Informed her  I would have registar call her with appt to see a provider.

## 2015-12-06 ENCOUNTER — Other Ambulatory Visit (HOSPITAL_COMMUNITY): Payer: Self-pay | Admitting: Nurse Practitioner

## 2015-12-06 DIAGNOSIS — B182 Chronic viral hepatitis C: Secondary | ICD-10-CM

## 2015-12-08 ENCOUNTER — Ambulatory Visit (INDEPENDENT_AMBULATORY_CARE_PROVIDER_SITE_OTHER): Payer: Medicare Other | Admitting: Obstetrics and Gynecology

## 2015-12-08 ENCOUNTER — Encounter: Payer: Self-pay | Admitting: Obstetrics and Gynecology

## 2015-12-08 ENCOUNTER — Other Ambulatory Visit (HOSPITAL_COMMUNITY)
Admission: RE | Admit: 2015-12-08 | Discharge: 2015-12-08 | Disposition: A | Discharge status: Home / Self Care | Payer: Medicare Other | Source: Ambulatory Visit | Attending: Obstetrics and Gynecology | Admitting: Obstetrics and Gynecology

## 2015-12-08 VITALS — BP 181/122 | HR 86

## 2015-12-08 DIAGNOSIS — Z01419 Encounter for gynecological examination (general) (routine) without abnormal findings: Secondary | ICD-10-CM

## 2015-12-08 DIAGNOSIS — Z1151 Encounter for screening for human papillomavirus (HPV): Secondary | ICD-10-CM

## 2015-12-08 DIAGNOSIS — D259 Leiomyoma of uterus, unspecified: Secondary | ICD-10-CM | POA: Diagnosis not present

## 2015-12-08 DIAGNOSIS — Z124 Encounter for screening for malignant neoplasm of cervix: Secondary | ICD-10-CM

## 2015-12-08 DIAGNOSIS — Z1231 Encounter for screening mammogram for malignant neoplasm of breast: Secondary | ICD-10-CM

## 2015-12-08 MED ORDER — TRAMADOL HCL 50 MG PO TABS: 50.0000 mg | ORAL_TABLET | Freq: Four times a day (QID) | ORAL | Status: DC | PRN

## 2015-12-08 NOTE — Progress Notes (Signed)
M[facility] scheduled for June 14 @ 0820 [facility].  Pt notified.  Pt reports not taking medication for BP and denies any symptoms at this time.
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
## 2015-12-09 LAB — CYTOLOGY - PAP

## 2015-12-21 ENCOUNTER — Ambulatory Visit: Payer: Medicare Other

## 2015-12-22 ENCOUNTER — Ambulatory Visit (INDEPENDENT_AMBULATORY_CARE_PROVIDER_SITE_OTHER): Payer: Medicare Other | Admitting: General Practice

## 2015-12-22 ENCOUNTER — Ambulatory Visit
Admission: RE | Admit: 2015-12-22 | Discharge: 2015-12-22 | Disposition: A | Discharge status: Home / Self Care | Payer: Medicare Other | Source: Ambulatory Visit | Attending: Obstetrics and Gynecology | Admitting: Obstetrics and Gynecology

## 2015-12-22 VITALS — BP 171/113 | HR 73 | Ht 63.0 in | Wt 177.0 lb

## 2015-12-22 DIAGNOSIS — D259 Leiomyoma of uterus, unspecified: Secondary | ICD-10-CM | POA: Diagnosis not present

## 2015-12-22 DIAGNOSIS — Z1231 Encounter for screening mammogram for malignant neoplasm of breast: Secondary | ICD-10-CM

## 2015-12-22 MED ORDER — LEUPROLIDE ACETATE (3 MONTH) 11.25 MG IM KIT
11.2500 mg | PACK | Freq: Once | INTRAMUSCULAR | Status: AC
  Administered 2015-12-22: 11.25 mg via INTRAMUSCULAR

## 2015-12-22 NOTE — Progress Notes (Signed)
Patient states she has not taken her BP medication today but plans to after leaving. Patient also requests sooner appt with pain clinic. Patient states her appt is on 6/24 and she cannot get transportation there. [facility] Told patient we will call her later with that information

## 2015-12-23 ENCOUNTER — Telehealth: Payer: Self-pay

## 2015-12-23 NOTE — Telephone Encounter (Signed)
Contacted patient in regards to her not going to Pain Clinic appt that is scheduled for next Saturday.  I explained to the patient that it is important that she goes to the appt because we can no longer prescribe her pain medication for her pain.  Pt stated that she is having transportation issues.  I confirmed with pt if she had Medicaid and if Medicaid transportation could help her.  Pt stated that they informed her that they do not run all day and that she would need to be at appt for at least 4 hours.  I asked pt about [facility], pt stated that she will need to call them to see how they run.  I advised pt to call me on Monday and if she does not I will call her on Monday to find out about [facility] and how we can get her to the appt.  Pt agreed.

## 2015-12-26 NOTE — Telephone Encounter (Signed)
Patient called and left message stating never mind don't worry about me, I got an appt for the 27th so I'm all good.

## 2016-01-12 ENCOUNTER — Ambulatory Visit (HOSPITAL_COMMUNITY): Payer: Medicare Other

## 2016-02-08 ENCOUNTER — Telehealth: Payer: Self-pay | Admitting: *Deleted

## 2016-02-08 NOTE — Telephone Encounter (Signed)
Received message left on nurse voicemail on 02/08/16 at 1352.  Patient states the doctor referred her to a pain clinic.  States her first appointment was cancelled and rescheduled because the doctor was out of the office.  The next appointment was given on a day when she couldn't go in.  States she has another appointment scheduled for the end of the month.  Wants to know if she can have one more refill on her pain medication.  Requests a return call to [phone].

## 2016-02-09 NOTE — Telephone Encounter (Signed)
According to patient chart provider would allow one refill until patient gets into the pain clinic. She had appointment but the office had to reschedule her do to doctor begin out of the office.

## 2016-02-21 ENCOUNTER — Ambulatory Visit (HOSPITAL_COMMUNITY): Admission: RE | Admit: 2016-02-21 | Payer: Medicare Other | Source: Ambulatory Visit

## 2016-03-15 ENCOUNTER — Ambulatory Visit: Payer: Medicare Other | Admitting: Podiatry

## 2016-03-21 ENCOUNTER — Encounter: Payer: Self-pay | Admitting: Podiatry

## 2016-03-21 ENCOUNTER — Ambulatory Visit (INDEPENDENT_AMBULATORY_CARE_PROVIDER_SITE_OTHER): Payer: Medicare Other

## 2016-03-21 ENCOUNTER — Ambulatory Visit (INDEPENDENT_AMBULATORY_CARE_PROVIDER_SITE_OTHER): Payer: Medicare Other | Admitting: Podiatry

## 2016-03-21 VITALS — BP 131/90 | HR 83 | Resp 16 | Ht 64.0 in | Wt 173.0 lb

## 2016-03-21 DIAGNOSIS — M6789 Other specified disorders of synovium and tendon, multiple sites: Secondary | ICD-10-CM | POA: Diagnosis not present

## 2016-03-21 DIAGNOSIS — M25572 Pain in left ankle and joints of left foot: Secondary | ICD-10-CM

## 2016-03-21 DIAGNOSIS — M76829 Posterior tibial tendinitis, unspecified leg: Secondary | ICD-10-CM

## 2016-03-21 NOTE — Progress Notes (Signed)
   Subjective:    Patient ID: Kristin Bond, female    DOB: 31-Mar-1966, 50 y.o.   MRN: [id number]  HPI  Chief Complaint  Patient presents with  . Ankle Pain    Left x 3 yrs off and on ... "feels like arthritis and I have a rod in my ankle ... can't stand cold."      Review of Systems  All other systems reviewed and are negative.      Objective:   Physical Exam        Assessment & Plan:

## 2016-03-22 NOTE — Progress Notes (Signed)
Subjective:     Patient ID: Bobbe Medico, female   DOB: 12-21-1965, 50 y.o.   MRN: [id number]  HPI patient presents stating she's had a lot of pain in her left ankle and she has inability to walk normally   Review of Systems  All other systems reviewed and are negative.      Objective:   Physical Exam  Constitutional: She is oriented to person, place, and time.  Cardiovascular: Intact distal pulses.   Musculoskeletal: Normal range of motion.  Neurological: She is oriented to person, place, and time.  Skin: Skin is warm.  Nursing note and vitals reviewed.  neurovascular status intact muscle strength adequate range of motion within normal limits right with severe limitation of motion of the subtalar midtarsal joint left secondary to fusion with moderate inversion of the left foot in a fixed position with reasonable ankle motion. 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 She is not interested in any further surgery to try to use shoe modification to see if we can help her somewhat. She is referred to [facility] for this condition  X-ray report indicated that there is 2 large screws in the calcaneus one in the talonavicular with what appears to be apparent fusion based on x-ray

## 2016-03-23 ENCOUNTER — Ambulatory Visit: Payer: Medicare Other

## 2016-03-28 ENCOUNTER — Ambulatory Visit (INDEPENDENT_AMBULATORY_CARE_PROVIDER_SITE_OTHER): Payer: Medicare Other | Admitting: *Deleted

## 2016-03-28 VITALS — BP 155/90 | HR 82

## 2016-03-28 DIAGNOSIS — D259 Leiomyoma of uterus, unspecified: Secondary | ICD-10-CM | POA: Diagnosis not present

## 2016-03-28 DIAGNOSIS — R102 Pelvic and perineal pain: Secondary | ICD-10-CM | POA: Diagnosis present

## 2016-03-28 MED ORDER — LEUPROLIDE ACETATE (3 MONTH) 11.25 MG IM KIT
11.2500 mg | PACK | Freq: Once | INTRAMUSCULAR | Status: AC
  Administered 2016-03-28: 11.25 mg via INTRAMUSCULAR

## 2016-03-28 NOTE — Progress Notes (Signed)
Took bp med late today patient states

## 2016-05-10 ENCOUNTER — Ambulatory Visit (HOSPITAL_COMMUNITY): Payer: Medicare Other

## 2016-05-14 ENCOUNTER — Ambulatory Visit (INDEPENDENT_AMBULATORY_CARE_PROVIDER_SITE_OTHER): Payer: Medicare Other | Admitting: Orthopedic Surgery

## 2016-05-16 ENCOUNTER — Encounter (INDEPENDENT_AMBULATORY_CARE_PROVIDER_SITE_OTHER): Payer: Self-pay | Admitting: Family

## 2016-05-16 ENCOUNTER — Ambulatory Visit (INDEPENDENT_AMBULATORY_CARE_PROVIDER_SITE_OTHER): Payer: Medicare Other | Admitting: Family

## 2016-05-16 VITALS — Ht 64.0 in | Wt 173.0 lb

## 2016-05-16 DIAGNOSIS — M25512 Pain in left shoulder: Secondary | ICD-10-CM

## 2016-05-16 DIAGNOSIS — G8929 Other chronic pain: Secondary | ICD-10-CM | POA: Diagnosis not present

## 2016-05-16 MED ORDER — METHYLPREDNISOLONE ACETATE 40 MG/ML IJ SUSP
40.0000 mg | INTRAMUSCULAR | Status: AC | PRN
  Administered 2016-05-16: 40 mg via INTRA_ARTICULAR

## 2016-05-16 MED ORDER — LIDOCAINE HCL 1 % IJ SOLN
5.0000 mL | INTRAMUSCULAR | Status: AC | PRN
  Administered 2016-05-16: 5 mL
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
## 2016-05-25 ENCOUNTER — Encounter: Payer: Self-pay | Admitting: Obstetrics & Gynecology

## 2016-05-25 ENCOUNTER — Ambulatory Visit (INDEPENDENT_AMBULATORY_CARE_PROVIDER_SITE_OTHER): Payer: Medicare Other | Admitting: Obstetrics & Gynecology

## 2016-05-25 VITALS — BP 150/84 | Wt 173.0 lb

## 2016-05-25 DIAGNOSIS — D259 Leiomyoma of uterus, unspecified: Secondary | ICD-10-CM | POA: Diagnosis present

## 2016-05-25 DIAGNOSIS — R5383 Other fatigue: Secondary | ICD-10-CM

## 2016-05-25 DIAGNOSIS — Z0000 Encounter for general adult medical examination without abnormal findings: Secondary | ICD-10-CM

## 2016-05-25 NOTE — Progress Notes (Signed)
   Subjective:    Patient ID: Kristin Bond, female    DOB: 04-Aug-1965, 50 y.o.   MRN: [id number]  HPI  51 yo S AA P9 here to her desire to stop using her Lupron. She is unhappy that she has lost 3 pounds since starting Lupron and feels that it has come out of her thighs and she is not happy with this. She has been on Lupron for 6+ years. She is feeling fatigued. She no longer has periods.  Review of Systems She quit going to a pain management clinic. Pap and mammogram UTD, this summer    Objective:   Physical Exam [id number] Breathing, conversing, and ambulating normally Abd-benign       Assessment & Plan:  Fatigue- check CBC, TSH, vit D- She refuses the blood work today, has an appt with her primary care md today Fibroids at 35- stop Lupron RTC prn

## 2016-06-05 ENCOUNTER — Ambulatory Visit (HOSPITAL_COMMUNITY): Admission: RE | Admit: 2016-06-05 | Payer: Medicare Other | Source: Ambulatory Visit

## 2016-06-12 ENCOUNTER — Other Ambulatory Visit: Payer: Self-pay | Admitting: Internal Medicine

## 2016-06-12 DIAGNOSIS — E2839 Other primary ovarian failure: Secondary | ICD-10-CM

## 2016-06-13 ENCOUNTER — Ambulatory Visit: Payer: Medicare Other

## 2016-06-13 ENCOUNTER — Ambulatory Visit (INDEPENDENT_AMBULATORY_CARE_PROVIDER_SITE_OTHER): Payer: Medicare Other | Admitting: Family

## 2016-06-20 ENCOUNTER — Ambulatory Visit (HOSPITAL_COMMUNITY)
Admission: RE | Admit: 2016-06-20 | Discharge: 2016-06-20 | Disposition: A | Discharge status: Home / Self Care | Payer: Medicare Other | Source: Ambulatory Visit | Attending: Nurse Practitioner | Admitting: Nurse Practitioner

## 2016-06-20 ENCOUNTER — Ambulatory Visit: Payer: Medicare Other

## 2016-06-20 DIAGNOSIS — B182 Chronic viral hepatitis C: Secondary | ICD-10-CM | POA: Diagnosis not present

## 2016-06-20 DIAGNOSIS — R932 Abnormal findings on diagnostic imaging of liver and biliary tract: Secondary | ICD-10-CM | POA: Insufficient documentation

## 2016-06-20 DIAGNOSIS — I77811 Abdominal aortic ectasia: Secondary | ICD-10-CM | POA: Diagnosis not present

## 2016-06-27 ENCOUNTER — Other Ambulatory Visit: Payer: Medicare Other

## 2016-06-28 ENCOUNTER — Other Ambulatory Visit: Payer: Medicare Other

## 2016-07-03 ENCOUNTER — Ambulatory Visit
Admission: RE | Admit: 2016-07-03 | Discharge: 2016-07-03 | Disposition: A | Discharge status: Home / Self Care | Payer: Medicare Other | Source: Ambulatory Visit | Attending: Internal Medicine | Admitting: Internal Medicine

## 2016-07-03 DIAGNOSIS — E2839 Other primary ovarian failure: Secondary | ICD-10-CM

## 2017-01-29 ENCOUNTER — Other Ambulatory Visit: Payer: Self-pay | Admitting: Nurse Practitioner

## 2017-01-29 DIAGNOSIS — K7469 Other cirrhosis of liver: Secondary | ICD-10-CM

## 2017-02-05 ENCOUNTER — Other Ambulatory Visit: Payer: Medicare Other

## 2017-02-11 ENCOUNTER — Other Ambulatory Visit: Payer: Medicare Other

## 2017-02-14 ENCOUNTER — Ambulatory Visit: Payer: Medicare Other | Admitting: Obstetrics and Gynecology

## 2017-02-15 ENCOUNTER — Other Ambulatory Visit: Payer: Medicare Other

## 2017-02-20 ENCOUNTER — Ambulatory Visit: Payer: Medicare Other | Admitting: Obstetrics and Gynecology

## 2017-02-21 ENCOUNTER — Ambulatory Visit
Admission: RE | Admit: 2017-02-21 | Discharge: 2017-02-21 | Disposition: A | Discharge status: Home / Self Care | Payer: Medicare Other | Source: Ambulatory Visit | Attending: Nurse Practitioner | Admitting: Nurse Practitioner

## 2017-02-21 DIAGNOSIS — K7469 Other cirrhosis of liver: Secondary | ICD-10-CM

## 2017-03-22 ENCOUNTER — Ambulatory Visit: Payer: Medicare Other | Admitting: Obstetrics and Gynecology

## 2017-03-23 ENCOUNTER — Encounter (HOSPITAL_COMMUNITY): Payer: Self-pay | Admitting: Emergency Medicine

## 2017-03-23 ENCOUNTER — Emergency Department (HOSPITAL_COMMUNITY): Payer: Medicare Other

## 2017-03-23 ENCOUNTER — Emergency Department (HOSPITAL_COMMUNITY)
Admission: EM | Admit: 2017-03-23 | Discharge: 2017-03-23 | Disposition: A | Discharge status: Home / Self Care | Payer: Medicare Other | Attending: Emergency Medicine | Admitting: Emergency Medicine

## 2017-03-23 DIAGNOSIS — I10 Essential (primary) hypertension: Secondary | ICD-10-CM | POA: Insufficient documentation

## 2017-03-23 DIAGNOSIS — Z79899 Other long term (current) drug therapy: Secondary | ICD-10-CM | POA: Insufficient documentation

## 2017-03-23 DIAGNOSIS — M79621 Pain in right upper arm: Secondary | ICD-10-CM | POA: Diagnosis present

## 2017-03-23 DIAGNOSIS — M65221 Calcific tendinitis, right upper arm: Secondary | ICD-10-CM | POA: Diagnosis not present

## 2017-03-23 DIAGNOSIS — M79601 Pain in right arm: Secondary | ICD-10-CM

## 2017-03-23 DIAGNOSIS — J4521 Mild intermittent asthma with (acute) exacerbation: Secondary | ICD-10-CM | POA: Diagnosis not present

## 2017-03-23 DIAGNOSIS — M7521 Bicipital tendinitis, right shoulder: Secondary | ICD-10-CM

## 2017-03-23 DIAGNOSIS — F17210 Nicotine dependence, cigarettes, uncomplicated: Secondary | ICD-10-CM | POA: Diagnosis not present

## 2017-03-23 LAB — BASIC METABOLIC PANEL
ANION GAP: 6 (ref 5–15)
BUN: 14 mg/dL (ref 6–20)
CHLORIDE: 108 mmol/L (ref 101–111)
CO2: 22 mmol/L (ref 22–32)
Calcium: 8.7 mg/dL — ABNORMAL LOW (ref 8.9–10.3)
Creatinine, Ser: 0.77 mg/dL (ref 0.44–1.00)
GFR calc non Af Amer: >60 mL/min (ref >60)
GLUCOSE: 86 mg/dL (ref 65–99)
POTASSIUM: 3.6 mmol/L (ref 3.5–5.1)
Sodium: 136 mmol/L (ref 135–145)

## 2017-03-23 LAB — CBC WITH DIFFERENTIAL/PLATELET
BASOS ABS: 0 10*3/uL (ref 0.0–0.1)
BASOS PCT: 0 %
Eosinophils Absolute: 0.1 10*3/uL (ref 0.0–0.7)
Eosinophils Relative: 1 %
HEMATOCRIT: 42.8 % (ref 36.0–46.0)
HEMOGLOBIN: 14.1 g/dL (ref 12.0–15.0)
LYMPHS PCT: 59 %
Lymphs Abs: 4.3 10*3/uL — ABNORMAL HIGH (ref 0.7–4.0)
MCH: 29.1 pg (ref 26.0–34.0)
MCHC: 32.9 g/dL (ref 30.0–36.0)
MCV: 88.4 fL (ref 78.0–100.0)
Monocytes Absolute: 0.5 10*3/uL (ref 0.1–1.0)
Monocytes Relative: 7 %
NEUTROS ABS: 2.5 10*3/uL (ref 1.7–7.7)
NEUTROS PCT: 33 %
Platelets: 201 10*3/uL (ref 150–400)
RBC: 4.84 MIL/uL (ref 3.87–5.11)
RDW: 15.9 % — ABNORMAL HIGH (ref 11.5–15.5)
WBC: 7.5 10*3/uL (ref 4.0–10.5)

## 2017-03-23 LAB — I-STAT TROPONIN, ED: Troponin i, poc: 0 ng/mL (ref 0.00–0.08)

## 2017-03-23 MED ORDER — HYDROCODONE-ACETAMINOPHEN 5-325 MG PO TABS: 1.0000 | ORAL_TABLET | Freq: Four times a day (QID) | ORAL | 0 refills | Status: DC | PRN

## 2017-03-23 MED ORDER — PREDNISONE 20 MG PO TABS: 60.0000 mg | ORAL_TABLET | Freq: Every day | ORAL | 0 refills | Status: DC

## 2017-03-23 MED ORDER — HYDROCODONE-ACETAMINOPHEN 5-325 MG PO TABS
2.0000 | ORAL_TABLET | Freq: Once | ORAL | Status: AC
  Administered 2017-03-23: 2 via ORAL
  Filled 2017-03-23: qty 2

## 2017-03-23 MED ORDER — MORPHINE SULFATE (PF) 4 MG/ML IV SOLN
6.0000 mg | Freq: Once | INTRAVENOUS | Status: AC
  Administered 2017-03-23: 6 mg via INTRAMUSCULAR
  Filled 2017-03-23: qty 2

## 2017-03-23 MED ORDER — PREDNISONE 20 MG PO TABS
60.0000 mg | ORAL_TABLET | Freq: Once | ORAL | Status: AC
  Administered 2017-03-23: 60 mg via ORAL
  Filled 2017-03-23: qty 3

## 2017-03-23 MED ORDER — ALBUTEROL SULFATE HFA 108 (90 BASE) MCG/ACT IN AERS
2.0000 | INHALATION_SPRAY | Freq: Once | RESPIRATORY_TRACT | Status: AC
  Administered 2017-03-23: 2 via RESPIRATORY_TRACT
  Filled 2017-03-23: qty 6.7

## 2017-03-23 MED ORDER — ALBUTEROL SULFATE (2.5 MG/3ML) 0.083% IN NEBU
5.0000 mg | INHALATION_SOLUTION | Freq: Once | RESPIRATORY_TRACT | Status: AC
  Administered 2017-03-23: 5 mg via RESPIRATORY_TRACT
  Filled 2017-03-23: qty 6

## 2017-03-23 NOTE — ED Notes (Signed)
Pt c/o right upper arm pain x yesterday. Denies injury. States that it "aches and throbs" "feels like its down to the bone. "Maybe I slept on it wrong". Pt took ibuprofen at 2200 and went to bed. States that the pain woke her up.

## 2017-03-23 NOTE — ED Notes (Signed)
ED Provider at bedside. 

## 2017-03-23 NOTE — ED Notes (Signed)
Patient transported to X-ray 

## 2017-03-23 NOTE — ED Provider Notes (Signed)
TIME SEEN: 3:21 AM  CHIEF COMPLAINT: Right upper arm pain  HPI: Patient is a 51 year old female with history of hypertension, asthma who presents emergency department with right upper arm pain. She denies any injury to her arm. She states pain is worse with palpation and with movement. She denies any chest pain or shortness of breath. She states that she has not injured the arm. Patient is left-hand dominant. She is hypertensive here and states she's been taking her blood pressure medication but she states she thinks this is because she is in pain. She has never had similar symptoms.  ROS: See HPI Constitutional: no fever  Eyes: no drainage  ENT: no runny nose   Cardiovascular:  no chest pain  Resp: no SOB  GI: no vomiting GU: no dysuria Integumentary: no rash  Allergy: no hives  Musculoskeletal: no leg swelling  Neurological: no slurred speech ROS otherwise negative  PAST MEDICAL HISTORY/PAST SURGICAL HISTORY:  Past Medical History:  Diagnosis Date  . Asthma   . Depression    bipolar  . Fibroids   . Hypertension     MEDICATIONS:  Prior to Admission medications   Medication Sig Start Date End Date Taking? Authorizing Provider  DULoxetine (CYMBALTA) 30 MG capsule Take 90 mg by mouth every morning.     [provider]  fluticasone (FLONASE) 50 MCG/ACT nasal spray Place 1 spray into the nose daily as needed for allergies.  04/11/11   [provider]  leuprolide (LUPRON) 11.25 MG injection Inject 11.25 mg into the muscle every 3 (three) months.     [provider]  lisinopril-hydrochlorothiazide (PRINZIDE,ZESTORETIC) 20-12.5 MG per tablet Take 1 tablet by mouth daily. 09/17/14   [provider]  sucralfate (CARAFATE) 1 G tablet Take 1 g by mouth 4 (four) times daily. 09/30/14   [provider]  VENTOLIN HFA 108 (90 BASE) MCG/ACT inhaler Inhale 2 puffs into the lungs every 6 (six) hours as needed. Shortness of breath 04/06/11   [provider]    ALLERGIES:  Allergies  Allergen Reactions  . Ibuprofen Other (See Comments)    Stomach pain     SOCIAL HISTORY:  Social History  Substance Use Topics  . Smoking status: Current Every Day Smoker    Packs/day: 0.50    Years: 30.00    Types: Cigarettes  . Smokeless tobacco: Never Used  . Alcohol use Yes     Comment: occasionally    FAMILY HISTORY: Family History  Problem Relation Age of Onset  . Hypertension Mother     EXAM: BP (!) 209/120 (BP Location: Left Arm)   Pulse 82   Temp 97.9 F (36.6 C) (Oral)   Resp 20   Ht 5\' 2"  (1.575 m)   Wt 81.6 kg (180 lb)   LMP 06/18/2014   SpO2 95%   BMI 32.92 kg/m  CONSTITUTIONAL: Alert and oriented and responds appropriately to questions. Well-appearing; well-nourished HEAD: Normocephalic EYES: Conjunctivae clear, pupils appear equal, EOMI ENT: normal nose; moist mucous membranes NECK: Supple, no meningismus, no nuchal rigidity, no LAD  CARD: RRR; S1 and S2 appreciated; no murmurs, no clicks, no rubs, no gallops RESP: Normal chest excursion without splinting or tachypnea; breath sounds clear and equal bilaterally; no wheezes, no rhonchi, no rales, no hypoxia or respiratory distress, speaking full sentences ABD/GI: Normal bowel sounds; non-distended; soft, non-tender, no rebound, no guarding, no peritoneal signs, no hepatosplenomegaly BACK:  The back appears normal and is non-tender to palpation, there is no  CVA tenderness EXT: Patient is tender to palpation over the right proximal humerus just distal to the shoulder over the biceps tendon without inflammation, erythema or warmth. There is no ecchymosis or deformity. Compartments are soft. 2+ radial pulses bilaterally. Normal grip strength in the right hand. Normal sensation throughout the right arm. She has full range of motion in the right arm. Normal ROM in all joints; non-tender to palpation; no edema; normal capillary refill; no cyanosis, no calf tenderness or  swelling    SKIN: Normal color for age and race; warm; no rash NEURO: Moves all extremities equally PSYCH: The patient's mood and manner are appropriate. Grooming and personal hygiene are appropriate.  MEDICAL DECISION MAKING: Patient here with complaints of right arm pain. This seems to be biceps tendinitis. She is tender over the insertion of the biceps tendon. No sign of cellulitis, septic arthritis, gout, DVT, arterial obstruction on exam. Will obtain x-rays of her forearm because she keeps describing this as "bone" pain. She does have bony tenderness on exam but no deformity. She denies any chest pain or shortness of breath. She is hypertensive but reports this is typical for her when she is in pain. We'll give morphine for pain control. She has her family here to drive her home. We'll obtain a screening EKG but she again denies chest pain or shortness of breath.  ED PROGRESS: X-ray shows no acute abnormality. Patient appears reassured. EKG shows no new ischemic change. Pain improved after morphine. On my reevaluation she is still hypertensive and now slightly hypoxic. She now describes having intermittent chest tightness and feeling short of breath. She states that these symptoms are typical of her asthma and she has had intermittent wheezing. She does have some wheezing on exam but I'm concerned that some of this could be cardiac in nature although patient does not feel that it would be. She does agree however to obtaining cardiac labs, chest x-ray and receiving a breathing treatment. She does not wear oxygen at home.   6:50 AM  Pt's troponin negative. Chest x-ray clear. Her lungs have cleared and she no longer has any expiratory wheezing. She has good aeration. No longer hypoxic. Chest tightness has resolved. She feels that she is having a mild asthma exacerbation. We'll discharge with albuterol inhaler as well as a steroid burst. We'll discharge her short course of Vicodin for pain control. Will  give orthopedic follow-up as I feel she is biceps tendinitis. I'm not concerned that this is her anginal equivalent. I feel she is safe to be discharged. Patient is also comfortable with this plan. Her hypertension has also improved as her pain has improved. I am not concerned for PE or dissection.   At this time, I do not feel there is any life-threatening condition present. I have reviewed and discussed all results (EKG, imaging, lab, urine as appropriate) and exam findings with patient/family. I have reviewed nursing notes and appropriate previous records.  I feel the patient is safe to be discharged home without further emergent workup and can continue workup as an outpatient as needed. Discussed usual and customary return precautions. Patient/family verbalize understanding and are comfortable with this plan.  Outpatient follow-up has been provided if needed. All questions have been answered.    EKG Interpretation  Date/Time:  Saturday March 23 2017 05:14:15 EDT Ventricular Rate:  68 PR Interval:    QRS Duration: 95 QT Interval:  417 QTC Calculation: 444 R Axis:   70 Text Interpretation:  Sinus rhythm  Probable anteroseptal infarct, old Borderline repolarization abnormality No significant change since last tracing Confirmed by Thanos Cousineau, Cyril Mourning [phone]) on 03/23/2017 5:24:35 AM         Venetia Prewitt, Delice Bison, DO 03/23/17 [phone]

## 2017-03-23 NOTE — ED Triage Notes (Signed)
Patient reports right arm pain onset yesterday , denies injury , pain increases with movement /palpation , pt. suspects that she laid on it while sleeping , no deformity or swelling .

## 2017-03-23 NOTE — ED Notes (Signed)
Pt's SpO2 saturation continued to drop below 90s when pt fell asleep. Upon awakening, SpO2 levels increased. Pt ambulated around nurse's station with oxygen levels remaining at 100%.

## 2017-03-29 ENCOUNTER — Encounter: Payer: Self-pay | Admitting: Obstetrics & Gynecology

## 2017-03-29 ENCOUNTER — Other Ambulatory Visit (HOSPITAL_COMMUNITY)
Admission: RE | Admit: 2017-03-29 | Discharge: 2017-03-29 | Disposition: A | Discharge status: Home / Self Care | Payer: Medicare Other | Source: Ambulatory Visit | Attending: Obstetrics & Gynecology | Admitting: Obstetrics & Gynecology

## 2017-03-29 ENCOUNTER — Ambulatory Visit (INDEPENDENT_AMBULATORY_CARE_PROVIDER_SITE_OTHER): Payer: Medicare Other | Admitting: Obstetrics & Gynecology

## 2017-03-29 VITALS — BP 144/99 | HR 86 | Ht 62.0 in | Wt 185.5 lb

## 2017-03-29 DIAGNOSIS — Z124 Encounter for screening for malignant neoplasm of cervix: Secondary | ICD-10-CM | POA: Diagnosis not present

## 2017-03-29 DIAGNOSIS — Z1151 Encounter for screening for human papillomavirus (HPV): Secondary | ICD-10-CM

## 2017-03-29 DIAGNOSIS — R58 Hemorrhage, not elsewhere classified: Secondary | ICD-10-CM

## 2017-03-29 DIAGNOSIS — Z01419 Encounter for gynecological examination (general) (routine) without abnormal findings: Secondary | ICD-10-CM | POA: Diagnosis not present

## 2017-03-29 DIAGNOSIS — Z113 Encounter for screening for infections with a predominantly sexual mode of transmission: Secondary | ICD-10-CM

## 2017-03-29 NOTE — Progress Notes (Signed)
Subjective:    Kristin Bond is a 51 y.o. S AA P0 female who presents for an annual exam. The patient has no complaints today. The patient is sexually active. GYN screening history: last pap: was normal. The patient wears seatbelts: yes. The patient participates in regular exercise: "I want to, I go walking sometimes". Has the patient ever been transfused or tattooed?: yes. The patient reports that there is not domestic violence in her life.   Menstrual History: OB History    Gravida Para Term Preterm AB Living   0 0 0 0 0 0   SAB TAB Ectopic Multiple Live Births   0 0 0 0        Menarche age: 83 Patient's last menstrual period was 06/18/2014.    The following portions of the patient's history were reviewed and updated as appropriate: allergies, current medications, past family history, past medical history, past social history, past surgical history and problem list.  Review of Systems Pertinent items are noted in HPI.   No breast, gyn, colon cancer Monogamous for about 3 months S/p colonoscopy this year Flu vaccine this year Already had mammogram this year   Objective:    BP (!) 144/99   Pulse 86   Ht 5\' 2"  (1.575 m)   Wt 185 lb 8 oz (84.1 kg)   LMP 06/18/2014   BMI 33.93 kg/m   General Appearance:    Alert, cooperative, no distress, appears stated age  Head:    Normocephalic, without obvious abnormality, atraumatic  Eyes:    PERRL, conjunctiva/corneas clear, EOM's intact, fundi    benign, both eyes  Ears:    Normal TM's and external ear canals, both ears  Nose:   Nares normal, septum midline, mucosa normal, no drainage    or sinus tenderness  Throat:   Lips, mucosa, and tongue normal; teeth and gums normal  Neck:   Supple, symmetrical, trachea midline, no adenopathy;    thyroid:  no enlargement/tenderness/nodules; no carotid   bruit or JVD  Back:     Symmetric, no curvature, ROM normal, no CVA tenderness  Lungs:     Clear to auscultation bilaterally, respirations  unlabored  Chest Wall:    No tenderness or deformity   Heart:    Regular rate and rhythm, S1 and S2 normal, no murmur, rub   or gallop  Breast Exam:    No tenderness, masses, or nipple abnormality  Abdomen:     Soft, non-tender, bowel sounds active all four quadrants,    no masses, no organomegaly  Genitalia:    Normal female without lesion, discharge or tenderness, difficult exam due to voluntary guarding     Extremities:   Extremities normal, atraumatic, no cyanosis or edema  Pulses:   2+ and symmetric all extremities  Skin:   Skin color, texture, turgor normal, no rashes or lesions  Lymph nodes:   Cervical, supraclavicular, and axillary nodes normal  Neurologic:   CNII-XII intact, normal strength, sensation and reflexes    throughout  .    Assessment:    Healthy female exam.   She had spotting last week, unsure if this is PMB or not   Plan:     Thin prep Pap smear. with cotesting Check [facility]

## 2017-03-30 LAB — RPR: RPR Ser Ql: NONREACTIVE

## 2017-03-30 LAB — HEPATITIS B SURFACE ANTIGEN: HEP B S AG: NEGATIVE

## 2017-03-30 LAB — FOLLICLE STIMULATING HORMONE: FSH: 33.6 m[IU]/mL

## 2017-03-30 LAB — HEPATITIS C ANTIBODY

## 2017-03-30 LAB — HIV ANTIBODY (ROUTINE TESTING W REFLEX): HIV Screen 4th Generation wRfx: NONREACTIVE

## 2017-04-02 LAB — CYTOLOGY - PAP
ADEQUACY: ABSENT
CHLAMYDIA, DNA PROBE: NEGATIVE
Diagnosis: NEGATIVE
HPV (WINDOPATH): NOT DETECTED
NEISSERIA GONORRHEA: NEGATIVE

## 2017-04-03 ENCOUNTER — Telehealth: Payer: Self-pay

## 2017-04-03 ENCOUNTER — Encounter: Payer: Self-pay | Admitting: Obstetrics & Gynecology

## 2017-04-03 DIAGNOSIS — N950 Postmenopausal bleeding: Secondary | ICD-10-CM | POA: Insufficient documentation

## 2017-04-03 MED ORDER — METRONIDAZOLE 500 MG PO TABS: 500.0000 mg | ORAL_TABLET | Freq: Two times a day (BID) | ORAL | 0 refills | Status: DC

## 2017-04-03 NOTE — Addendum Note (Signed)
Addended by: Phillip Heal, Pranathi Winfree A on: 04/03/2017 04:49 PM   Modules accepted: Orders

## 2017-04-03 NOTE — Telephone Encounter (Signed)
-----   Message from Emily Filbert, MD sent at 04/03/2017  4:33 PM EDT ----- Please treat her for trich. Her partner needs to be treated. Thanks

## 2017-04-03 NOTE — Telephone Encounter (Signed)
Patient inform of trich diagnosis and need to be treated.

## 2017-04-03 NOTE — Addendum Note (Signed)
Addended by: Phillip Heal, DEMETRICE A on: 04/03/2017 04:45 PM   Modules accepted: Orders

## 2017-04-03 NOTE — Telephone Encounter (Signed)
Patient needs to have gyn [place] for post menopausal bleeding.

## 2017-04-03 NOTE — Telephone Encounter (Signed)
-----   Message from Emily Filbert, MD sent at 04/01/2017 10:30 AM EDT ----- She has a + test for Hepatitis C. She will need a referral to ID.

## 2017-04-03 NOTE — Telephone Encounter (Signed)
Appointment scheduled for 04/10/2017 @11 :00

## 2017-04-03 NOTE — Addendum Note (Signed)
Addended by: Phillip Heal, DEMETRICE A on: 04/03/2017 04:48 PM   Modules accepted: Orders

## 2017-04-03 NOTE — Telephone Encounter (Signed)
Patient inform of hepatitis C. Per patient she was already aware of diagnosis and has already been treated.She does not want to go to ID at this time.

## 2017-04-03 NOTE — Telephone Encounter (Signed)
-----   Message from Emily Filbert, MD sent at 04/01/2017 10:31 AM EDT ----- She also is menopausal by her [facility], so her bleeding is PMB and she will need a gyn u/s ordered. Thanks

## 2017-04-04 NOTE — Telephone Encounter (Signed)
Patient left a voice message 04/03/17 5:07 pm asking for [facility] to call her right back.

## 2017-04-10 ENCOUNTER — Ambulatory Visit (HOSPITAL_COMMUNITY): Payer: Medicare Other

## 2017-04-15 ENCOUNTER — Ambulatory Visit (HOSPITAL_COMMUNITY): Payer: Medicare Other

## 2017-04-16 NOTE — Telephone Encounter (Signed)
It appears u/s was rescheduled for 04/19/17.

## 2017-04-19 ENCOUNTER — Ambulatory Visit (HOSPITAL_COMMUNITY): Payer: Medicare Other

## 2017-04-25 ENCOUNTER — Ambulatory Visit (HOSPITAL_COMMUNITY)
Admission: RE | Admit: 2017-04-25 | Discharge: 2017-04-25 | Disposition: A | Discharge status: Home / Self Care | Payer: Medicare Other | Source: Ambulatory Visit | Attending: Obstetrics & Gynecology | Admitting: Obstetrics & Gynecology

## 2017-04-25 DIAGNOSIS — N950 Postmenopausal bleeding: Secondary | ICD-10-CM | POA: Diagnosis present

## 2017-04-25 DIAGNOSIS — D259 Leiomyoma of uterus, unspecified: Secondary | ICD-10-CM | POA: Diagnosis not present

## 2017-05-02 ENCOUNTER — Encounter (HOSPITAL_COMMUNITY): Payer: Self-pay | Admitting: *Deleted

## 2017-05-02 ENCOUNTER — Emergency Department (HOSPITAL_COMMUNITY)
Admission: EM | Admit: 2017-05-02 | Discharge: 2017-05-02 | Disposition: A | Discharge status: Home / Self Care | Payer: Medicare Other | Attending: Emergency Medicine | Admitting: Emergency Medicine

## 2017-05-02 ENCOUNTER — Emergency Department (HOSPITAL_COMMUNITY): Payer: Medicare Other

## 2017-05-02 DIAGNOSIS — J45909 Unspecified asthma, uncomplicated: Secondary | ICD-10-CM | POA: Diagnosis not present

## 2017-05-02 DIAGNOSIS — I10 Essential (primary) hypertension: Secondary | ICD-10-CM | POA: Diagnosis not present

## 2017-05-02 DIAGNOSIS — Z79899 Other long term (current) drug therapy: Secondary | ICD-10-CM | POA: Diagnosis not present

## 2017-05-02 DIAGNOSIS — F17210 Nicotine dependence, cigarettes, uncomplicated: Secondary | ICD-10-CM | POA: Insufficient documentation

## 2017-05-02 DIAGNOSIS — M79601 Pain in right arm: Secondary | ICD-10-CM | POA: Diagnosis not present

## 2017-05-02 MED ORDER — HYDROCODONE-ACETAMINOPHEN 5-325 MG PO TABS
1.0000 | ORAL_TABLET | Freq: Once | ORAL | Status: AC
  Administered 2017-05-02: 1 via ORAL
  Filled 2017-05-02: qty 1

## 2017-05-02 MED ORDER — ENOXAPARIN SODIUM 80 MG/0.8ML ~~LOC~~ SOLN
80.0000 mg | Freq: Once | SUBCUTANEOUS | Status: AC
  Administered 2017-05-02: 80 mg via SUBCUTANEOUS
  Filled 2017-05-02: qty 0.8

## 2017-05-02 MED ORDER — HYDROCODONE-ACETAMINOPHEN 5-325 MG PO TABS: 1.0000 | ORAL_TABLET | ORAL | 0 refills | Status: DC | PRN

## 2017-05-02 NOTE — Progress Notes (Signed)
ANTICOAGULATION CONSULT NOTE - Initial Consult  Pharmacy Consult for Lovenox Indication: DVT  Allergies  Allergen Reactions  . Ibuprofen Other (See Comments)    Stomach pain     Patient Measurements: Height: 5\' 3"  (160 cm) Weight: 185 lb (83.9 kg) IBW/kg (Calculated) : 52.4   Vital Signs: Temp: 98.5 F (36.9 C) (10/25 1953) Temp Source: Oral (10/25 1953) BP: 190/108 (10/25 1953) Pulse Rate: 70 (10/25 1953)  Labs: No results for input(s): HGB, HCT, PLT, APTT, LABPROT, INR, HEPARINUNFRC, HEPRLOWMOCWT, CREATININE, CKTOTAL, CKMB, TROPONINI in the last 72 hours.  CrCl cannot be calculated (Patient's most recent lab result is older than the maximum 21 days allowed.).  Assessment: 80 YOF here with RUE pain for the past few days- describes pain as deep and achy.  She is to go for [place] tomorrow to r/o DVT. To get 1 dose of Lovenox treatment tonight until then. She is not on anticoagulation at home.  Goal of Therapy:  Anti-Xa level 0.6-1 units/ml 4hrs after LMWH dose given Monitor platelets by anticoagulation protocol: Yes   Plan:  -Lovenox 80mg  (~1mg /kg) subQ x1 tonight in the ED -She can receive another dose of an anticoagulant 12h after dose is given- since she is coming back in the morning, she could start treatment then if needed  Tristin Vandeusen D. [place], PharmD, [facility] Clinical Pharmacist [phone] 05/02/2017 8:17 PM

## 2017-05-02 NOTE — Discharge Instructions (Signed)
There were no acute abnormalities on the x-ray.   Follow the attached instructions in this packet to return tomorrow for an ultrasound to assure that you do not have a blood clot in the arm.  Antiinflammatory medications: Take 600 mg of ibuprofen every 6 hours or 440 mg (over the counter dose) to 500 mg (prescription dose) of naproxen every 12 hours for the next 3 days. After this time, these medications may be used as needed for pain. Take these medications with food to avoid upset stomach. Choose only one of these medications, do not take them together. Tylenol: Should you continue to have additional pain while taking the ibuprofen or naproxen, you may add in tylenol as needed. Your daily total maximum amount of tylenol from all sources should be limited to 4000mg /day for persons without liver problems, or 2000mg /day for those with liver problems. Vicodin: Take the Vicodin for severe pain.  Do not drive or perform other dangerous activities while taking the Vicodin.  Follow-up with your primary care provider or the orthopedic specialist on this matter.
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
## 2017-05-02 NOTE — ED Triage Notes (Signed)
To ED for eval right upper arm pain for the past few days. States the pain is deep and achy. Some relief with a heating pad and ibuprofen. No injury noted. No deformity noted.

## 2017-05-03 ENCOUNTER — Inpatient Hospital Stay (HOSPITAL_COMMUNITY): Admission: RE | Admit: 2017-05-03 | Payer: Medicare Other | Source: Ambulatory Visit

## 2017-05-06 ENCOUNTER — Ambulatory Visit (HOSPITAL_COMMUNITY): Admission: RE | Admit: 2017-05-06 | Payer: Medicare Other | Source: Ambulatory Visit

## 2017-05-16 ENCOUNTER — Telehealth: Payer: Self-pay | Admitting: *Deleted

## 2017-05-16 NOTE — Telephone Encounter (Signed)
I called Kristin Bond and gave her the results.  She voices understanding. She is requesting another appointment- I transferred her call to appointment line.

## 2017-05-16 NOTE — Telephone Encounter (Signed)
-----   Message from Emily Filbert, MD sent at 04/29/2017  9:58 AM EDT ----- Her endometrium was very thin, 76mm, so she does not need a biopsy.

## 2017-05-17 ENCOUNTER — Other Ambulatory Visit: Payer: Self-pay | Admitting: Internal Medicine

## 2017-05-17 DIAGNOSIS — Z1231 Encounter for screening mammogram for malignant neoplasm of breast: Secondary | ICD-10-CM

## 2017-05-22 ENCOUNTER — Other Ambulatory Visit (HOSPITAL_COMMUNITY)
Admission: RE | Admit: 2017-05-22 | Discharge: 2017-05-22 | Disposition: A | Discharge status: Home / Self Care | Payer: Medicare Other | Source: Ambulatory Visit | Attending: Obstetrics & Gynecology | Admitting: Obstetrics & Gynecology

## 2017-05-22 ENCOUNTER — Encounter: Payer: Self-pay | Admitting: Obstetrics & Gynecology

## 2017-05-22 ENCOUNTER — Ambulatory Visit (INDEPENDENT_AMBULATORY_CARE_PROVIDER_SITE_OTHER): Payer: Medicare Other | Admitting: Obstetrics & Gynecology

## 2017-05-22 VITALS — BP 168/101 | HR 83 | Wt 191.6 lb

## 2017-05-22 DIAGNOSIS — N898 Other specified noninflammatory disorders of vagina: Secondary | ICD-10-CM

## 2017-05-22 DIAGNOSIS — Z0000 Encounter for general adult medical examination without abnormal findings: Secondary | ICD-10-CM

## 2017-05-22 DIAGNOSIS — Z113 Encounter for screening for infections with a predominantly sexual mode of transmission: Secondary | ICD-10-CM

## 2017-05-22 NOTE — Progress Notes (Signed)
   Subjective:    Patient ID: Kristin Bond, female    DOB: Nov 20, 1965, 51 y.o.   MRN: [id number]  HPI 51 yo AA lady here for follow up of her post menopausal spotting. Her u/s showed a 2 mm endometrium and multiple fibroids. She also reports a vaginal odor. She has not used any treatments for this.   Review of Systems     Objective:   Physical Exam Breathing, conversing, and ambulating normally Well nourished, well hydrated Black female, no apparent distress She declines a vaginal exam today but is willing to do a self swab of her vagina.      Assessment & Plan:  Post menopausal spotting with 2 mm endometrium- I have reassured her that she does not need any uterine sampling.  We will send her wet prep and treat accordingly. Mammogram ordered

## 2017-05-23 LAB — CERVICOVAGINAL ANCILLARY ONLY
BACTERIAL VAGINITIS: NEGATIVE
CANDIDA VAGINITIS: NEGATIVE
TRICH (WINDOWPATH): NEGATIVE

## 2017-06-18 ENCOUNTER — Ambulatory Visit: Payer: Medicare Other

## 2017-07-18 ENCOUNTER — Ambulatory Visit: Payer: Medicare Other

## 2017-08-26 ENCOUNTER — Other Ambulatory Visit: Payer: Self-pay | Admitting: Nurse Practitioner

## 2017-08-26 DIAGNOSIS — K7400 Hepatic fibrosis, unspecified: Secondary | ICD-10-CM

## 2017-09-02 ENCOUNTER — Ambulatory Visit
Admission: RE | Admit: 2017-09-02 | Discharge: 2017-09-02 | Disposition: A | Discharge status: Home / Self Care | Payer: Medicare Other | Source: Ambulatory Visit | Attending: Nurse Practitioner | Admitting: Nurse Practitioner

## 2017-09-02 DIAGNOSIS — K7400 Hepatic fibrosis, unspecified: Secondary | ICD-10-CM

## 2017-09-09 ENCOUNTER — Telehealth: Payer: Self-pay | Admitting: Obstetrics & Gynecology

## 2017-09-09 NOTE — Telephone Encounter (Signed)
Received a call from Ms. Souder this morning about heavy bleeding with pain. Although she is a patient of Dr. Alease Medina, she has requested to see any provider available.

## 2017-09-11 ENCOUNTER — Ambulatory Visit (INDEPENDENT_AMBULATORY_CARE_PROVIDER_SITE_OTHER): Payer: Medicare Other | Admitting: Obstetrics and Gynecology

## 2017-09-11 ENCOUNTER — Encounter: Payer: Self-pay | Admitting: Obstetrics and Gynecology

## 2017-09-11 VITALS — BP 144/103 | HR 90 | Wt 195.0 lb

## 2017-09-11 DIAGNOSIS — N950 Postmenopausal bleeding: Secondary | ICD-10-CM
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
## 2017-09-11 NOTE — Progress Notes (Signed)
Pt stated having heavy bleeding since last week and seen dark string. Also, back pain and sharp pain both side of the abdominal

## 2017-09-16 ENCOUNTER — Ambulatory Visit (HOSPITAL_COMMUNITY): Payer: Medicare Other

## 2017-09-20 ENCOUNTER — Ambulatory Visit (HOSPITAL_COMMUNITY)
Admission: RE | Admit: 2017-09-20 | Discharge: 2017-09-20 | Disposition: A | Discharge status: Home / Self Care | Payer: Medicare Other | Source: Ambulatory Visit | Attending: Obstetrics and Gynecology | Admitting: Obstetrics and Gynecology

## 2017-09-20 DIAGNOSIS — N950 Postmenopausal bleeding: Secondary | ICD-10-CM | POA: Insufficient documentation

## 2017-09-20 DIAGNOSIS — D259 Leiomyoma of uterus, unspecified: Secondary | ICD-10-CM | POA: Insufficient documentation

## 2017-09-25 ENCOUNTER — Telehealth: Payer: Self-pay | Admitting: Obstetrics and Gynecology

## 2017-09-25 NOTE — Telephone Encounter (Signed)
Patient requesting a call back for her results.

## 2017-10-07 NOTE — Telephone Encounter (Signed)
Called patient and informed her of results. Patient verbalized understanding and asked if that's why she has been bleeding. Told patient it is certainly possible. Discussed Dr Rosana Hoes will review that with her at her appt on 4/17 and discuss treatment options for the bleeding. Patient verbalized understanding & had no questions

## 2017-10-23 ENCOUNTER — Ambulatory Visit: Payer: Medicare Other | Admitting: Obstetrics and Gynecology

## 2017-12-09 ENCOUNTER — Ambulatory Visit: Payer: Medicare Other | Admitting: Obstetrics and Gynecology

## 2018-01-06 ENCOUNTER — Inpatient Hospital Stay (HOSPITAL_COMMUNITY)
Admission: AD | Admit: 2018-01-06 | Discharge: 2018-01-06 | Disposition: A | Discharge status: Home / Self Care | Payer: Medicare Other | Source: Ambulatory Visit | Attending: Obstetrics & Gynecology | Admitting: Obstetrics & Gynecology

## 2018-01-06 ENCOUNTER — Ambulatory Visit (INDEPENDENT_AMBULATORY_CARE_PROVIDER_SITE_OTHER): Payer: Medicare Other | Admitting: Obstetrics and Gynecology

## 2018-01-06 ENCOUNTER — Other Ambulatory Visit (HOSPITAL_COMMUNITY)
Admission: RE | Admit: 2018-01-06 | Discharge: 2018-01-06 | Disposition: A | Discharge status: Home / Self Care | Payer: Medicare Other | Source: Ambulatory Visit | Attending: Obstetrics and Gynecology | Admitting: Obstetrics and Gynecology

## 2018-01-06 ENCOUNTER — Encounter: Payer: Self-pay | Admitting: Obstetrics and Gynecology

## 2018-01-06 VITALS — BP 211/112 | HR 77 | Wt 199.0 lb

## 2018-01-06 DIAGNOSIS — Z5321 Procedure and treatment not carried out due to patient leaving prior to being seen by health care provider: Secondary | ICD-10-CM | POA: Insufficient documentation

## 2018-01-06 DIAGNOSIS — A5901 Trichomonal vulvovaginitis: Secondary | ICD-10-CM | POA: Insufficient documentation

## 2018-01-06 DIAGNOSIS — K625 Hemorrhage of anus and rectum: Secondary | ICD-10-CM

## 2018-01-06 DIAGNOSIS — B9689 Other specified bacterial agents as the cause of diseases classified elsewhere: Secondary | ICD-10-CM

## 2018-01-06 DIAGNOSIS — K6289 Other specified diseases of anus and rectum: Secondary | ICD-10-CM | POA: Diagnosis not present

## 2018-01-06 DIAGNOSIS — N898 Other specified noninflammatory disorders of vagina: Secondary | ICD-10-CM

## 2018-01-06 DIAGNOSIS — N760 Acute vaginitis: Secondary | ICD-10-CM | POA: Insufficient documentation

## 2018-01-06 DIAGNOSIS — N950 Postmenopausal bleeding: Secondary | ICD-10-CM | POA: Diagnosis not present

## 2018-01-06 LAB — URINALYSIS, ROUTINE W REFLEX MICROSCOPIC
BILIRUBIN URINE: NEGATIVE
Bacteria, UA: NONE SEEN
GLUCOSE, UA: NEGATIVE mg/dL
KETONES UR: NEGATIVE mg/dL
LEUKOCYTES UA: NEGATIVE
NITRITE: NEGATIVE
Protein, ur: NEGATIVE mg/dL
Specific Gravity, Urine: 1.02 (ref 1.005–1.030)
pH: 6 (ref 5.0–8.0)

## 2018-01-06 LAB — POCT PREGNANCY, URINE: Preg Test, Ur: NEGATIVE

## 2018-01-06 MED ORDER — MEGESTROL ACETATE 40 MG PO TABS: 40.0000 mg | ORAL_TABLET | Freq: Two times a day (BID) | ORAL | 0 refills | Status: DC

## 2018-01-06 MED ORDER — TRAMADOL HCL 50 MG PO TABS: 100.0000 mg | ORAL_TABLET | Freq: Four times a day (QID) | ORAL | 0 refills | Status: AC | PRN

## 2018-01-06 MED ORDER — CLOTRIMAZOLE-BETAMETHASONE 1-0.05 % EX CREA: 1.0000 "application " | TOPICAL_CREAM | Freq: Two times a day (BID) | CUTANEOUS | 0 refills | Status: AC

## 2018-01-06 NOTE — MAU Note (Signed)
Patient not in lobby X1

## 2018-01-06 NOTE — MAU Note (Signed)
Patient not in lobby X3

## 2018-01-06 NOTE — MAU Note (Signed)
Patient not in lobby X2

## 2018-01-06 NOTE — MAU Provider Note (Signed)
Pt not in lobby. Left AMA without being seen by provider.

## 2018-01-06 NOTE — MAU Note (Signed)
Pt reports she had a coloscopy" sometime this year and her but has never been the same since". She has had rectal pain since. Went to her primary doctor and they gave her a perscri[ption with some steriods in it but it has to be approved and she cant keep waiting in this pain. Also stated her period started yesterday and she has not had one in 3 years. Stated she is having rectal bleeding but is sure she is having vaginal bleeding as well

## 2018-01-06 NOTE — Patient Instructions (Signed)
Postmenopausal Bleeding Postmenopausal bleeding is any bleeding after menopause. Menopause is when a woman's period stops. Any type of bleeding after menopause is concerning. It should be checked by your doctor. Any treatment will depend on the cause. Follow these instructions at home: Watch your condition for any changes.  Avoid the use of tampons and douches as told by your doctor.  Change your pads often.  Get regular pelvic exams and Pap tests.  Keep all appointments for tests as told by your doctor.  Contact a doctor if:  Your bleeding lasts for more than 1 week.  You have belly (abdominal) pain.  You have bleeding after sex (intercourse). Get help right away if:  You have a fever, chills, a headache, dizziness, muscle aches, and bleeding.  You have strong pain with bleeding.  You have clumps of blood (blood clots) coming from your vagina.  You have bleeding and need more than 1 pad an hour.  You feel like you are going to pass out (faint). This information is not intended to replace advice given to you by your health care provider. Make sure you discuss any questions you have with your health care provider. Document Released: 04/03/2008 Document Revised: 12/01/2015 Document Reviewed: 01/22/2013 Elsevier Interactive Patient Education  2017 Elsevier Inc.  

## 2018-01-08 LAB — CERVICOVAGINAL ANCILLARY ONLY
Bacterial vaginitis: POSITIVE — AB
CANDIDA VAGINITIS: NEGATIVE
Chlamydia: NEGATIVE
Neisseria Gonorrhea: NEGATIVE
TRICH (WINDOWPATH): POSITIVE — AB
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
## 2018-01-14 ENCOUNTER — Telehealth: Payer: Self-pay

## 2018-01-14 MED ORDER — METRONIDAZOLE 500 MG PO TABS: 500.0000 mg | ORAL_TABLET | Freq: Two times a day (BID) | ORAL | 0 refills | Status: DC

## 2018-01-14 NOTE — Telephone Encounter (Signed)
-----   Message from Katheren Shams, DO sent at 01/08/2018  7:49 PM EDT ----- Please inform patient of positive for BV and Trich. Treat with Flagyl 500mg  BID in order to treat both. Thanks!

## 2018-01-14 NOTE — Telephone Encounter (Signed)
Called patient inform her of test results patient was positive for trich and bv. Flagyl has been called into [facility].

## 2018-02-10 ENCOUNTER — Encounter: Payer: Self-pay | Admitting: Obstetrics and Gynecology

## 2018-02-10 ENCOUNTER — Ambulatory Visit: Payer: Medicare Other | Admitting: Obstetrics and Gynecology

## 2018-02-28 ENCOUNTER — Other Ambulatory Visit: Payer: Self-pay | Admitting: Nurse Practitioner

## 2018-02-28 DIAGNOSIS — K7469 Other cirrhosis of liver: Secondary | ICD-10-CM

## 2018-03-04 ENCOUNTER — Telehealth: Payer: Self-pay | Admitting: Obstetrics and Gynecology

## 2018-03-04 NOTE — Telephone Encounter (Signed)
Patient called to say she was in pain and needed something called in for it. Requesting a call back from the nurse today.

## 2018-03-06 ENCOUNTER — Encounter (HOSPITAL_COMMUNITY): Payer: Self-pay | Admitting: *Deleted

## 2018-03-06 ENCOUNTER — Other Ambulatory Visit: Payer: Medicare Other

## 2018-03-06 ENCOUNTER — Inpatient Hospital Stay (HOSPITAL_COMMUNITY)
Admission: AD | Admit: 2018-03-06 | Discharge: 2018-03-06 | Disposition: A | Discharge status: Home / Self Care | Payer: Medicare Other | Source: Ambulatory Visit | Attending: Obstetrics and Gynecology | Admitting: Obstetrics and Gynecology

## 2018-03-06 DIAGNOSIS — R109 Unspecified abdominal pain: Secondary | ICD-10-CM | POA: Insufficient documentation

## 2018-03-06 DIAGNOSIS — Z780 Asymptomatic menopausal state: Secondary | ICD-10-CM | POA: Insufficient documentation

## 2018-03-06 DIAGNOSIS — J45909 Unspecified asthma, uncomplicated: Secondary | ICD-10-CM | POA: Insufficient documentation

## 2018-03-06 DIAGNOSIS — Z8249 Family history of ischemic heart disease and other diseases of the circulatory system: Secondary | ICD-10-CM | POA: Insufficient documentation

## 2018-03-06 DIAGNOSIS — N939 Abnormal uterine and vaginal bleeding, unspecified: Secondary | ICD-10-CM

## 2018-03-06 DIAGNOSIS — F17210 Nicotine dependence, cigarettes, uncomplicated: Secondary | ICD-10-CM | POA: Insufficient documentation

## 2018-03-06 DIAGNOSIS — F329 Major depressive disorder, single episode, unspecified: Secondary | ICD-10-CM | POA: Insufficient documentation

## 2018-03-06 LAB — URINALYSIS, ROUTINE W REFLEX MICROSCOPIC
Bacteria, UA: NONE SEEN
Bilirubin Urine: NEGATIVE
GLUCOSE, UA: NEGATIVE mg/dL
Ketones, ur: NEGATIVE mg/dL
Leukocytes, UA: NEGATIVE
NITRITE: NEGATIVE
Protein, ur: NEGATIVE mg/dL
SPECIFIC GRAVITY, URINE: 1.011 (ref 1.005–1.030)
pH: 6 (ref 5.0–8.0)

## 2018-03-06 LAB — WET PREP, GENITAL
CLUE CELLS WET PREP: NONE SEEN
SPERM: NONE SEEN
Trich, Wet Prep: NONE SEEN
Yeast Wet Prep HPF POC: NONE SEEN
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
## 2018-03-06 NOTE — MAU Note (Signed)
Pt reports she was seen in the clinic one month ago as a follow up from a MAU visit for vaginal bleeding. Had a PAP and cultures and was given meds but bleeding has continued and is also having lower abd pain

## 2018-03-06 NOTE — Discharge Instructions (Signed)
Postmenopausal Bleeding Postmenopausal bleeding is any bleeding after menopause. Menopause is when a woman's period stops. Any type of bleeding after menopause is concerning. It should be checked by your doctor. Any treatment will depend on the cause. Follow these instructions at home: Watch your condition for any changes.  Avoid the use of tampons and douches as told by your doctor.  Change your pads often.  Get regular pelvic exams and Pap tests.  Keep all appointments for tests as told by your doctor.  Contact a doctor if:  Your bleeding lasts for more than 1 week.  You have belly (abdominal) pain.  You have bleeding after sex (intercourse). Get help right away if:  You have a fever, chills, a headache, dizziness, muscle aches, and bleeding.  You have strong pain with bleeding.  You have clumps of blood (blood clots) coming from your vagina.  You have bleeding and need more than 1 pad an hour.  You feel like you are going to pass out (faint). This information is not intended to replace advice given to you by your health care provider. Make sure you discuss any questions you have with your health care provider. Document Released: 04/03/2008 Document Revised: 12/01/2015 Document Reviewed: 01/22/2013 Elsevier Interactive Patient Education  2017 Elsevier Inc.  

## 2018-03-07 LAB — GC/CHLAMYDIA PROBE AMP (~~LOC~~) NOT AT ARMC
Chlamydia: NEGATIVE
NEISSERIA GONORRHEA: NEGATIVE

## 2018-03-11 ENCOUNTER — Other Ambulatory Visit: Payer: Medicare Other

## 2018-03-14 ENCOUNTER — Ambulatory Visit
Admission: RE | Admit: 2018-03-14 | Discharge: 2018-03-14 | Disposition: A | Discharge status: Home / Self Care | Payer: Medicare Other | Source: Ambulatory Visit | Attending: Nurse Practitioner | Admitting: Nurse Practitioner

## 2018-03-14 DIAGNOSIS — K7469 Other cirrhosis of liver: Secondary | ICD-10-CM

## 2018-03-28 ENCOUNTER — Encounter: Payer: Self-pay | Admitting: Obstetrics & Gynecology

## 2018-03-28 ENCOUNTER — Ambulatory Visit (INDEPENDENT_AMBULATORY_CARE_PROVIDER_SITE_OTHER): Payer: Medicare Other | Admitting: Obstetrics & Gynecology

## 2018-03-28 ENCOUNTER — Other Ambulatory Visit (HOSPITAL_COMMUNITY)
Admission: RE | Admit: 2018-03-28 | Discharge: 2018-03-28 | Disposition: A | Discharge status: Home / Self Care | Payer: Medicare Other | Source: Ambulatory Visit | Attending: Obstetrics & Gynecology | Admitting: Obstetrics & Gynecology

## 2018-03-28 VITALS — BP 150/86 | HR 84 | Wt 199.3 lb

## 2018-03-28 DIAGNOSIS — N950 Postmenopausal bleeding: Secondary | ICD-10-CM

## 2018-03-28 NOTE — Patient Instructions (Signed)

## 2018-03-28 NOTE — Progress Notes (Signed)
Pt BPs elevated.  Pt reports she did take her blood pressure medication today and is following up with her PCP next month in regards to her BP

## 2018-03-28 NOTE — Progress Notes (Signed)
   GYNECOLOGY OFFICE PROCEDURE NOTE  52 y.o. G0 here today for endometrial biopsy in the setting of postmenopausal bleeding.  Still spotting.  Normal pap and negative HRHPV on 03/29/2017  ENDOMETRIAL BIOPSY     The indications for endometrial biopsy were reviewed.   Risks of the biopsy including cramping, bleeding, infection, uterine perforation, inadequate specimen and need for additional procedures  were discussed. The patient states she understands and agrees to undergo procedure today. Consent was signed. Time out was performed. Speculum was placed in vagina,  the cervix was prepped with Betadine and Hurricane spray administered. A single-toothed tenaculum was placed on the anterior lip of the cervix to stabilize it.  Unable to pass pipelle through stenotic cervix.  Lacrimal dilators were used to open the cervix and the 3 mm pipelle was introduced into the endometrial cavity without difficulty to a depth of 5.5 cm, and a small amount of tissue was obtained and sent to pathology. She was screaming throughout the procedure, was given post-procedure Ibuprofen.  The instruments were removed from the patient's vagina. Minimal bleeding from the cervix was noted. The patient tolerated the procedure well. Routine post-procedure instructions were given to the patient.      Verita Schneiders, MD, [place] for [employer], [place]

## 2018-03-31 ENCOUNTER — Encounter: Payer: Self-pay | Admitting: *Deleted

## 2018-04-01 ENCOUNTER — Telehealth: Payer: Self-pay | Admitting: *Deleted

## 2018-04-01 NOTE — Telephone Encounter (Signed)
-----   Message from Osborne Oman, MD sent at 03/31/2018  5:14 PM EDT ----- 03/28/18 Endometrium, biopsy - DEGENERATING SECRETORY-TYPE ENDOMETRIUM - DECIDUALIZED STROMA, CONSISTENT WITH HORMONE EFFECT - THERE IS NO EVIDENCE OF HYPERPLASIA OR MALIGNANCY. Please call patient and let her know about her benign biopsy results. She can be scheduled to see Dr. Rosana Hoes for further discussion about management of her post menopausal bleeding

## 2018-04-01 NOTE — Telephone Encounter (Signed)
Pt called to inform her of her biopsy results and to tell her that the front office staff would be contacting her to schedule a follow up appointment with Dr. Rosana Hoes to discuss management of her post menopausal bleeding.

## 2018-04-16 ENCOUNTER — Encounter: Payer: Self-pay | Admitting: Obstetrics and Gynecology

## 2018-04-16 ENCOUNTER — Ambulatory Visit (INDEPENDENT_AMBULATORY_CARE_PROVIDER_SITE_OTHER): Payer: Medicare Other | Admitting: Obstetrics and Gynecology

## 2018-04-16 VITALS — BP 171/101 | HR 90 | Wt 195.1 lb

## 2018-04-16 DIAGNOSIS — N950 Postmenopausal bleeding: Secondary | ICD-10-CM | POA: Diagnosis not present
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
## 2018-08-29 ENCOUNTER — Other Ambulatory Visit: Payer: Self-pay | Admitting: Nurse Practitioner

## 2018-08-29 DIAGNOSIS — K7469 Other cirrhosis of liver: Secondary | ICD-10-CM

## 2018-09-16 ENCOUNTER — Other Ambulatory Visit: Payer: Medicare Other

## 2018-09-18 ENCOUNTER — Other Ambulatory Visit: Payer: Medicare Other

## 2018-09-23 ENCOUNTER — Ambulatory Visit
Admission: RE | Admit: 2018-09-23 | Discharge: 2018-09-23 | Disposition: A | Discharge status: Home / Self Care | Payer: Medicare Other | Source: Ambulatory Visit | Attending: Nurse Practitioner | Admitting: Nurse Practitioner

## 2018-09-23 DIAGNOSIS — K7469 Other cirrhosis of liver: Secondary | ICD-10-CM

## 2018-11-01 IMAGING — DX DG CHEST 2V
2 series · 2 of 2 positions shown · non-contrast
Comparison: Radiograph 08/17/2015

CLINICAL DATA: Chest tightness and shortness of breath.

EXAM:
CHEST  2 VIEW

[chest pa]
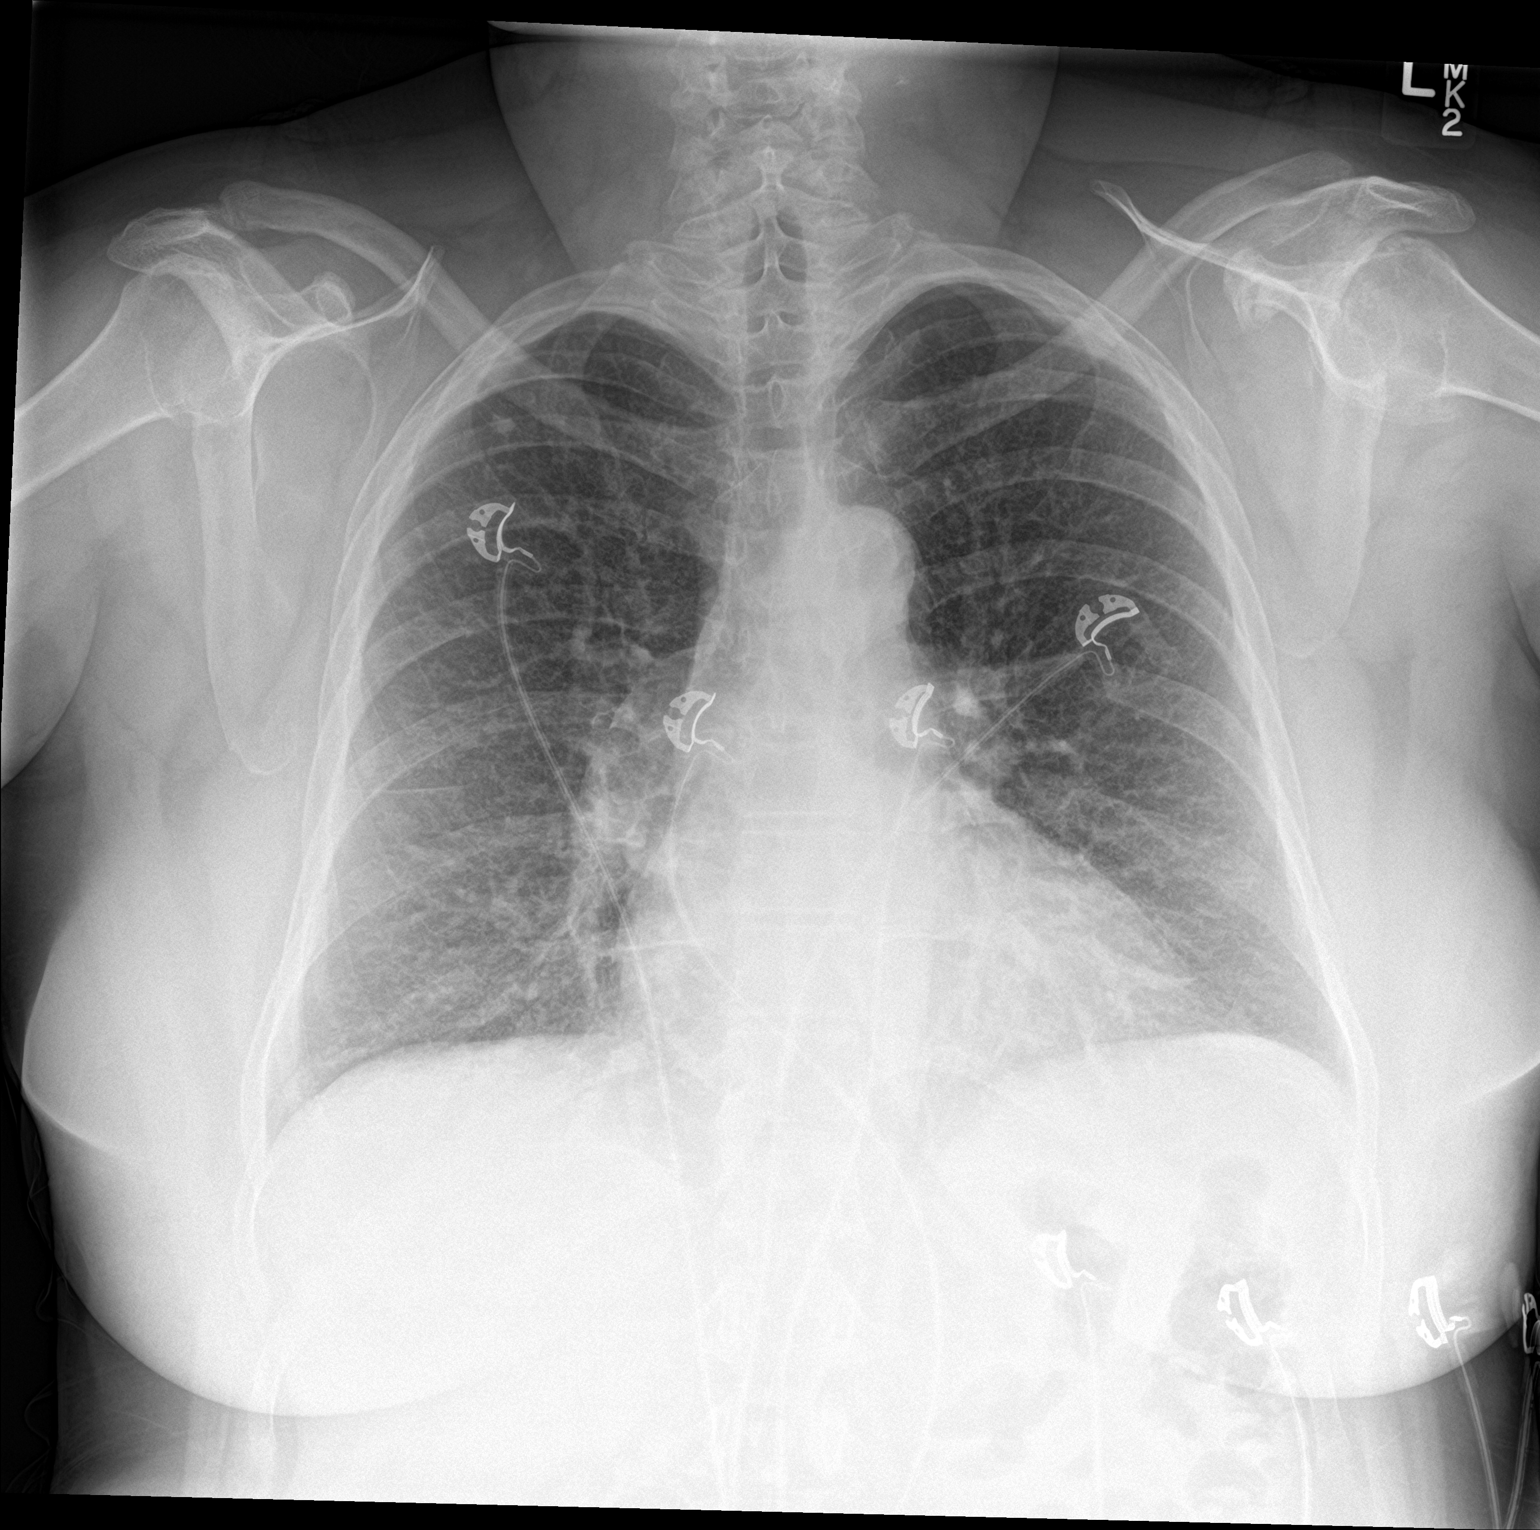

[chest lat]
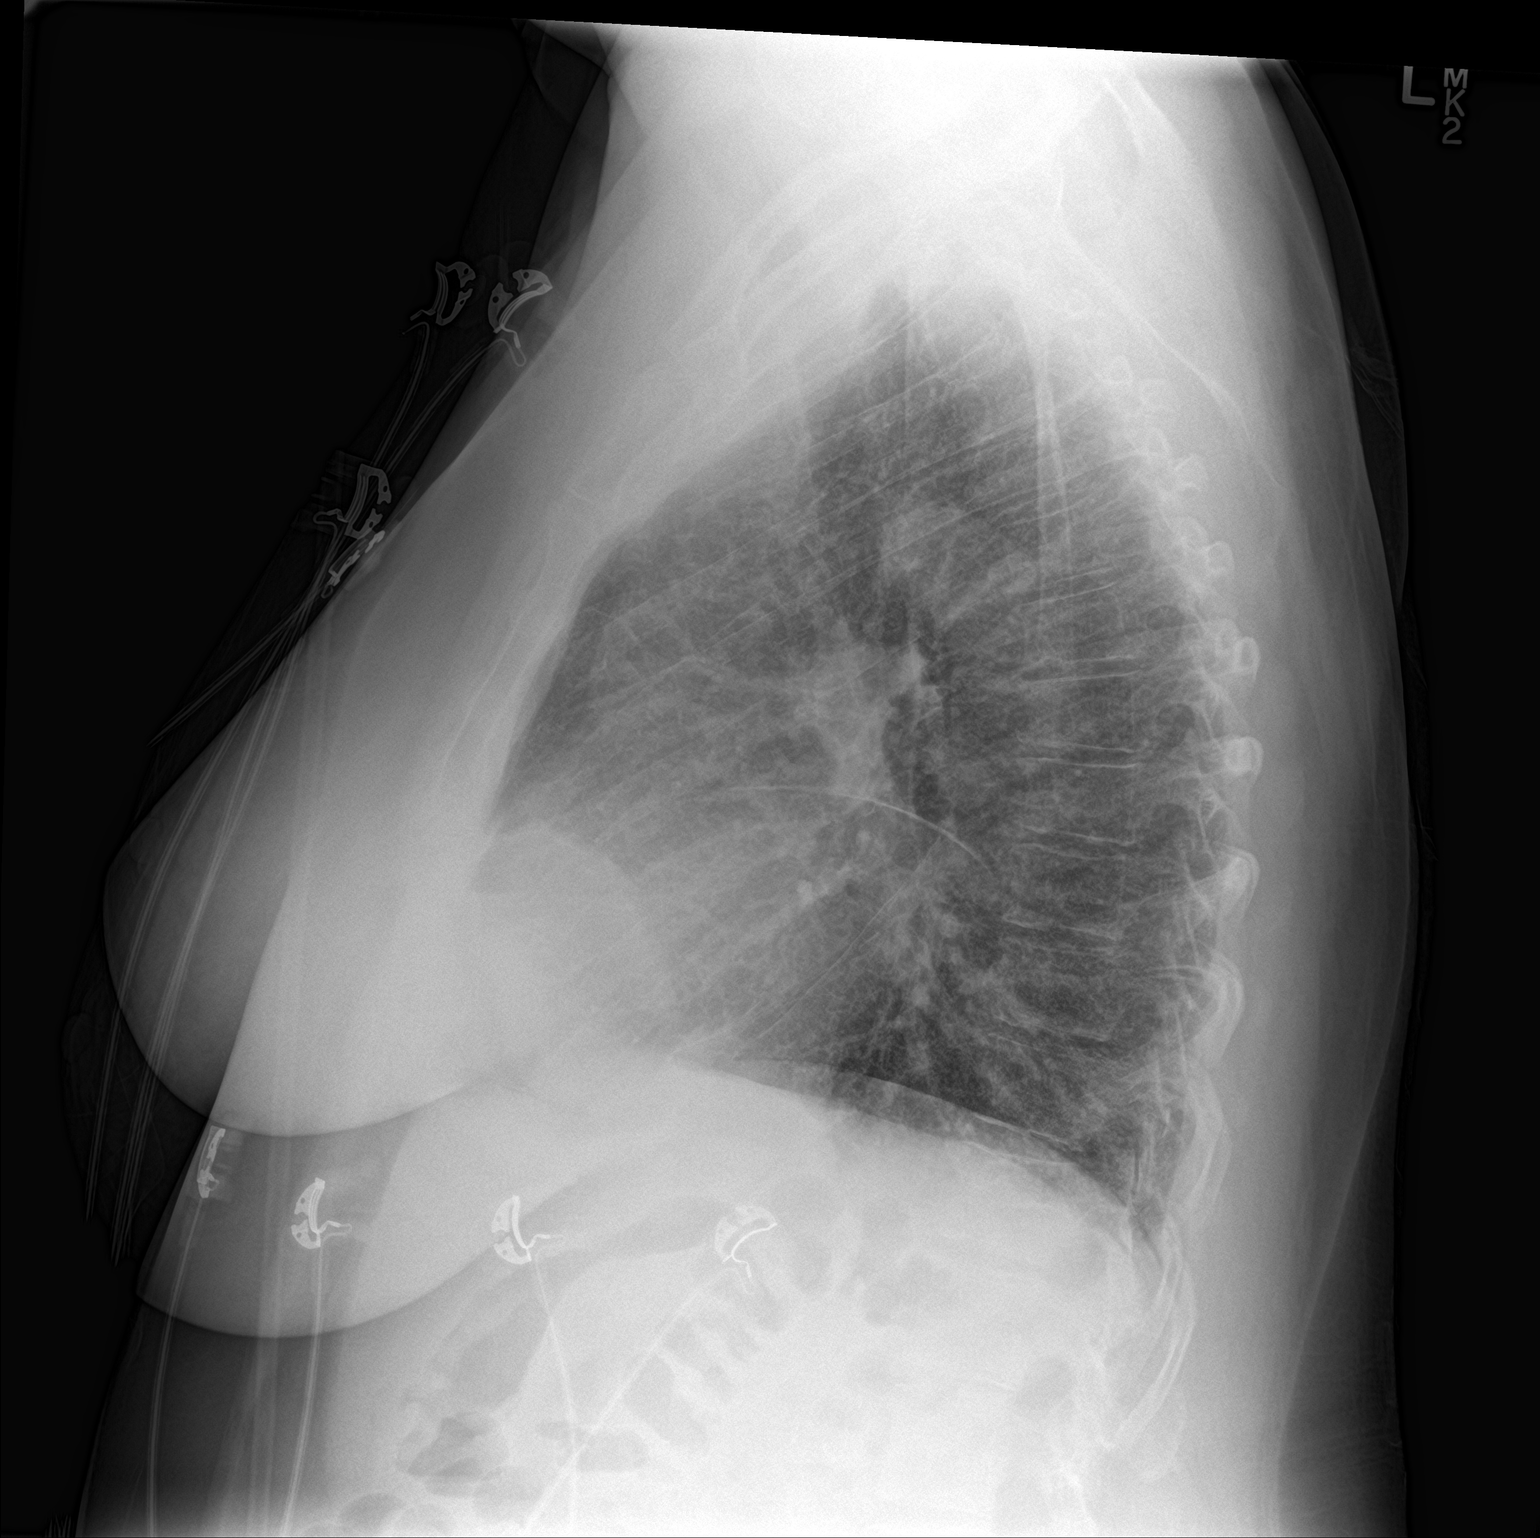

[2 of 2 positions shown; findings below may reference images not displayed]

FINDINGS: The cardiomediastinal contours are normal. Chronic peribronchial
thickening in the lower lobes, similar to prior exam. Calcified
granuloma in the right upper lobe. Pulmonary vasculature is normal.
No consolidation, pleural effusion, or pneumothorax. No acute
osseous abnormalities are seen.
IMPRESSION: Chronic lower lobe peribronchial thickening.  No acute abnormality.

## 2018-11-01 IMAGING — DX DG HUMERUS 2V *R*
2 series · 2 of 2 positions shown · non-contrast
Comparison: None.

CLINICAL DATA: Right humerus pain today.  No known injury.

EXAM:
RIGHT HUMERUS - 2+ VIEW

[humerus ap]
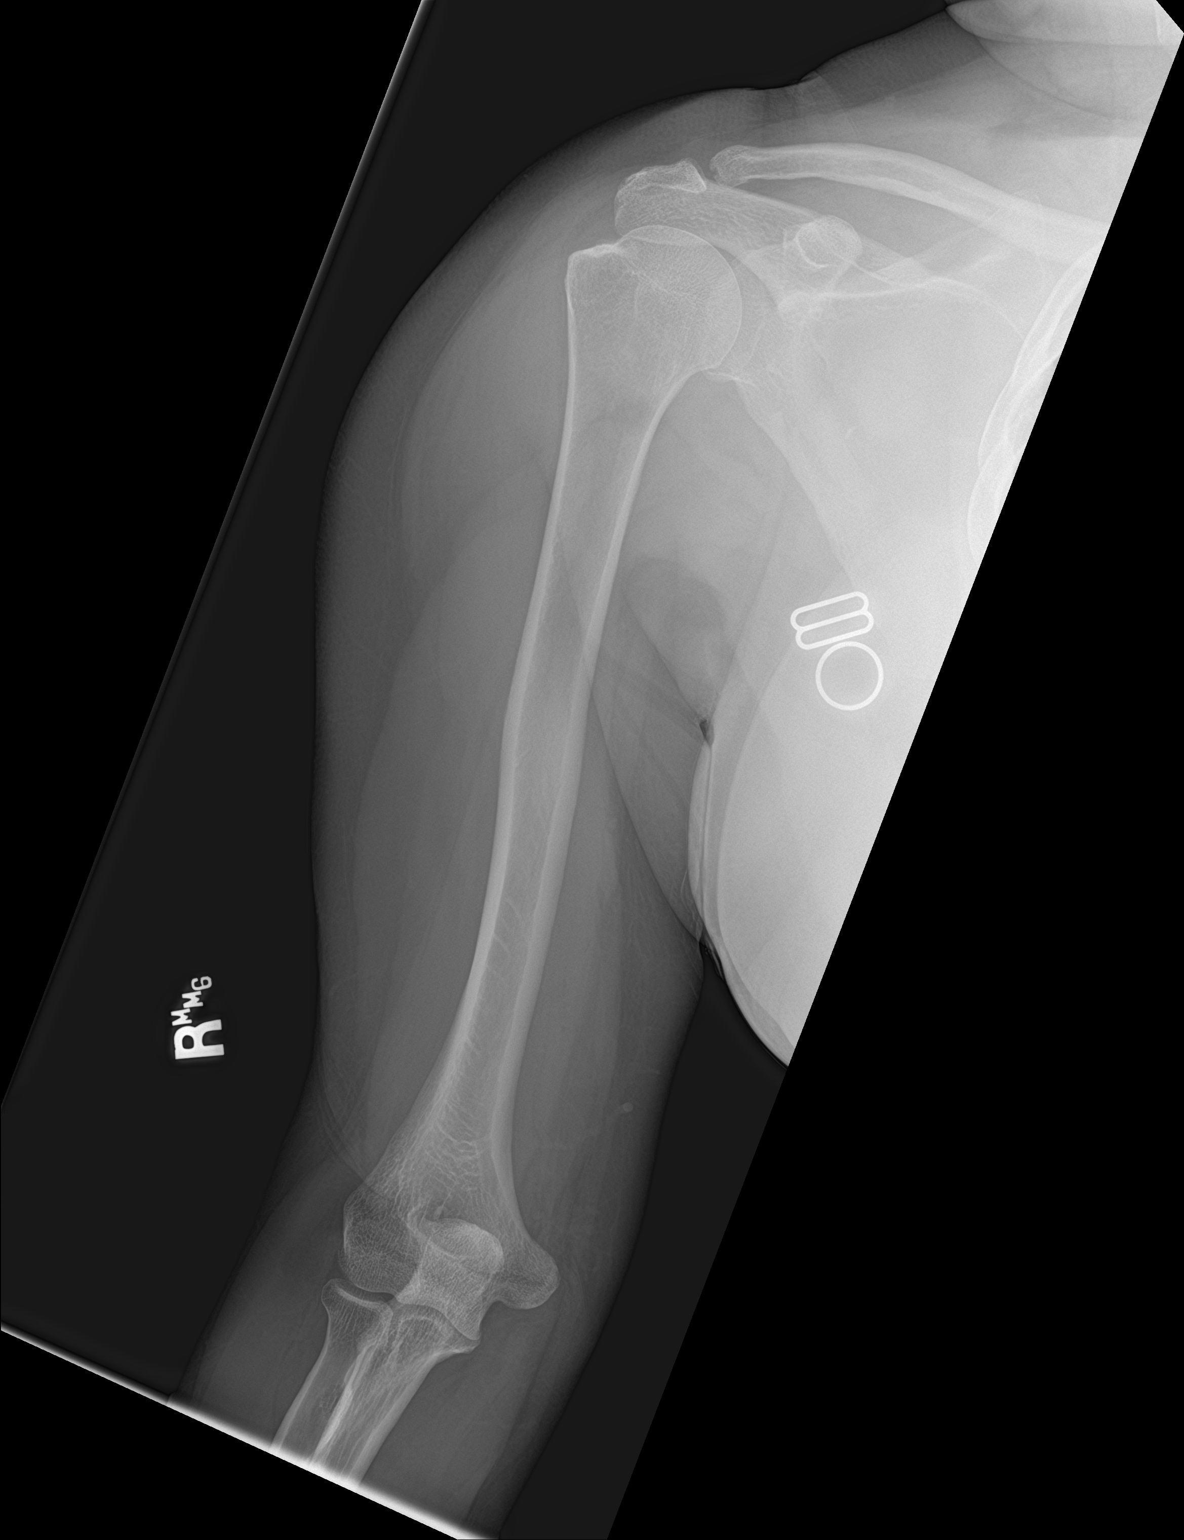

[humerus lat]
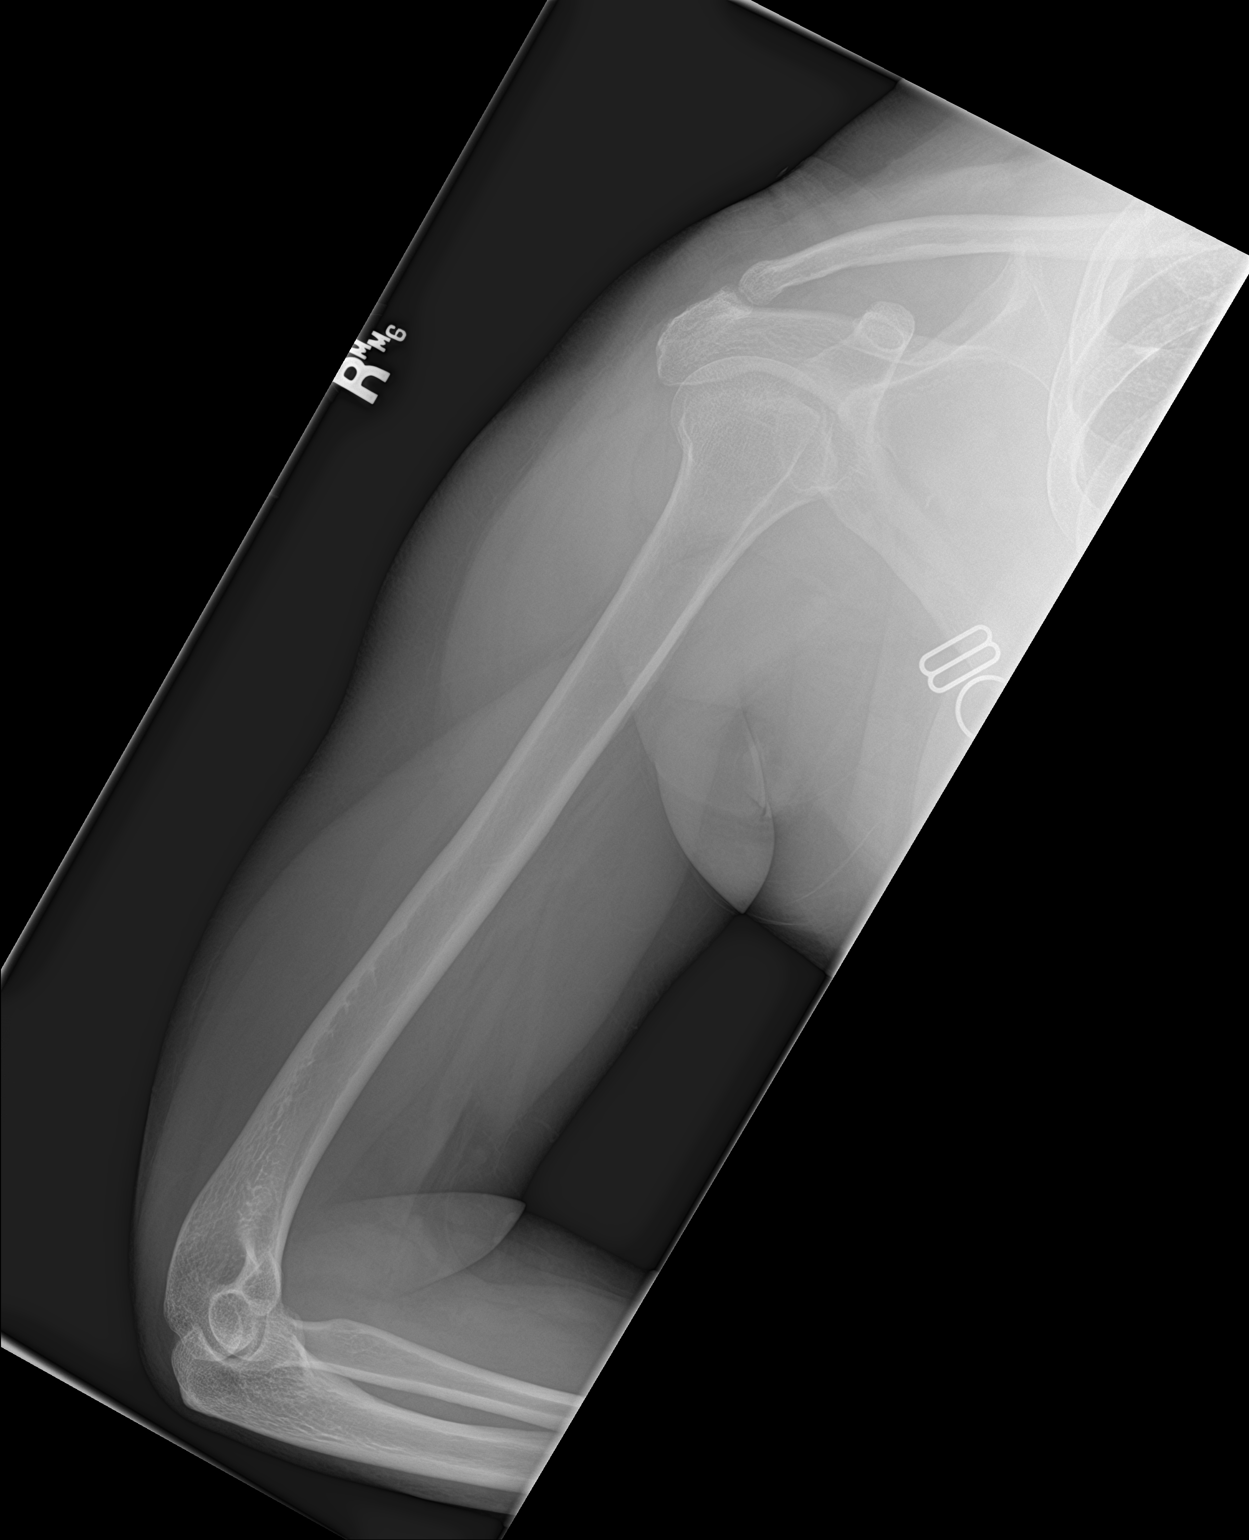

[2 of 2 positions shown; findings below may reference images not displayed]

FINDINGS: There is no evidence of fracture or other focal bone lesions.
Cortical margins of the humerus are intact. Shoulder and elbow
alignment are maintained. Soft tissues are unremarkable.
IMPRESSION: Unremarkable radiographs of the right humerus.

## 2019-03-09 ENCOUNTER — Other Ambulatory Visit: Payer: Self-pay | Admitting: Nurse Practitioner

## 2019-03-09 DIAGNOSIS — K7400 Hepatic fibrosis, unspecified: Secondary | ICD-10-CM

## 2019-03-11 ENCOUNTER — Other Ambulatory Visit: Payer: Medicare Other

## 2019-03-13 ENCOUNTER — Ambulatory Visit
Admission: RE | Admit: 2019-03-13 | Discharge: 2019-03-13 | Disposition: A | Discharge status: Home / Self Care | Payer: Medicare Other | Source: Ambulatory Visit | Attending: Nurse Practitioner | Admitting: Nurse Practitioner

## 2019-03-13 DIAGNOSIS — K7400 Hepatic fibrosis, unspecified: Secondary | ICD-10-CM

## 2019-10-22 ENCOUNTER — Ambulatory Visit: Payer: Self-pay

## 2019-10-22 ENCOUNTER — Ambulatory Visit (INDEPENDENT_AMBULATORY_CARE_PROVIDER_SITE_OTHER): Payer: Medicare Other | Admitting: Orthopedic Surgery

## 2019-10-22 ENCOUNTER — Other Ambulatory Visit: Payer: Self-pay

## 2019-10-22 VITALS — Ht 62.0 in | Wt 195.0 lb

## 2019-10-22 DIAGNOSIS — G8929 Other chronic pain: Secondary | ICD-10-CM

## 2019-10-22 DIAGNOSIS — M25561 Pain in right knee: Secondary | ICD-10-CM | POA: Diagnosis not present

## 2019-10-22 DIAGNOSIS — M25572 Pain in left ankle and joints of left foot: Secondary | ICD-10-CM

## 2019-10-23 ENCOUNTER — Encounter: Payer: Self-pay | Admitting: Orthopedic Surgery

## 2019-10-23 DIAGNOSIS — G8929 Other chronic pain: Secondary | ICD-10-CM | POA: Diagnosis not present

## 2019-10-23 DIAGNOSIS — M25561 Pain in right knee: Secondary | ICD-10-CM

## 2019-10-23 MED ORDER — LIDOCAINE HCL (PF) 1 % IJ SOLN
5.0000 mL | INTRAMUSCULAR | Status: AC | PRN
  Administered 2019-10-23: 5 mL

## 2019-10-23 MED ORDER — METHYLPREDNISOLONE ACETATE 40 MG/ML IJ SUSP
40.0000 mg | INTRAMUSCULAR | Status: AC | PRN
  Administered 2019-10-23: 40 mg via INTRA_ARTICULAR
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
## 2019-11-13 ENCOUNTER — Other Ambulatory Visit: Payer: Self-pay | Admitting: Nurse Practitioner

## 2019-11-13 DIAGNOSIS — K7469 Other cirrhosis of liver: Secondary | ICD-10-CM

## 2019-11-23 ENCOUNTER — Ambulatory Visit
Admission: RE | Admit: 2019-11-23 | Discharge: 2019-11-23 | Disposition: A | Discharge status: Home / Self Care | Payer: Medicare Other | Source: Ambulatory Visit | Attending: Nurse Practitioner | Admitting: Nurse Practitioner

## 2019-11-23 DIAGNOSIS — K7469 Other cirrhosis of liver: Secondary | ICD-10-CM

## 2019-11-28 ENCOUNTER — Ambulatory Visit (HOSPITAL_COMMUNITY)
Admission: EM | Admit: 2019-11-28 | Discharge: 2019-11-28 | Disposition: A | Discharge status: Home / Self Care | Payer: Medicare Other | Attending: Family Medicine | Admitting: Family Medicine

## 2019-11-28 ENCOUNTER — Other Ambulatory Visit: Payer: Self-pay

## 2019-11-28 ENCOUNTER — Encounter (HOSPITAL_COMMUNITY): Payer: Self-pay

## 2019-11-28 DIAGNOSIS — L0291 Cutaneous abscess, unspecified: Secondary | ICD-10-CM | POA: Insufficient documentation

## 2019-11-28 DIAGNOSIS — L03818 Cellulitis of other sites: Secondary | ICD-10-CM | POA: Diagnosis present

## 2019-11-28 MED ORDER — LIDOCAINE HCL (PF) 2 % IJ SOLN
INTRAMUSCULAR | Status: AC
  Filled 2019-11-28: qty 5

## 2019-11-28 MED ORDER — SULFAMETHOXAZOLE-TRIMETHOPRIM 800-160 MG PO TABS: 1.0000 | ORAL_TABLET | Freq: Two times a day (BID) | ORAL | 0 refills | Status: AC

## 2019-11-28 NOTE — Discharge Instructions (Signed)
Please keep the area clean  Please try the antibiotic  Please follow up if your symptoms fail to improve.
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
## 2019-11-28 NOTE — ED Triage Notes (Addendum)
Pt presents to UC with an abscess in left inner thigh x 3-4 days approx. Pt tried hydrocortisone cream without relief.

## 2019-11-30 ENCOUNTER — Telehealth: Payer: Self-pay | Admitting: Family Medicine

## 2019-11-30 LAB — AEROBIC CULTURE W GRAM STAIN (SUPERFICIAL SPECIMEN): Culture: NORMAL

## 2019-11-30 NOTE — Telephone Encounter (Signed)
Informed of culture results.   Rosemarie Ax, MD [facility] 11/30/2019, 11:12 AM

## 2020-06-15 ENCOUNTER — Other Ambulatory Visit: Payer: Self-pay | Admitting: Nurse Practitioner

## 2020-06-15 DIAGNOSIS — K7402 Hepatic fibrosis, advanced fibrosis: Secondary | ICD-10-CM

## 2020-06-23 ENCOUNTER — Ambulatory Visit
Admission: RE | Admit: 2020-06-23 | Discharge: 2020-06-23 | Disposition: A | Discharge status: Home / Self Care | Payer: Medicare Other | Source: Ambulatory Visit | Attending: Nurse Practitioner | Admitting: Nurse Practitioner

## 2020-06-23 DIAGNOSIS — K7402 Hepatic fibrosis, advanced fibrosis: Secondary | ICD-10-CM

## 2020-07-18 ENCOUNTER — Ambulatory Visit: Payer: Medicare Other | Admitting: Orthopedic Surgery

## 2020-11-15 ENCOUNTER — Ambulatory Visit: Payer: Medicare Other | Admitting: Orthopedic Surgery

## 2020-12-14 ENCOUNTER — Other Ambulatory Visit: Payer: Self-pay | Admitting: Nurse Practitioner

## 2020-12-14 DIAGNOSIS — K7402 Hepatic fibrosis, advanced fibrosis: Secondary | ICD-10-CM

## 2020-12-14 DIAGNOSIS — K760 Fatty (change of) liver, not elsewhere classified: Secondary | ICD-10-CM

## 2020-12-15 ENCOUNTER — Other Ambulatory Visit: Payer: Self-pay | Admitting: Internal Medicine

## 2020-12-16 LAB — SARS-COV-2 RNA,(COVID-19) QUALITATIVE NAAT: SARS CoV2 RNA: NOT DETECTED

## 2020-12-19 ENCOUNTER — Ambulatory Visit: Payer: Medicare Other | Admitting: Obstetrics & Gynecology

## 2020-12-22 ENCOUNTER — Encounter (HOSPITAL_COMMUNITY): Payer: Self-pay

## 2020-12-22 ENCOUNTER — Ambulatory Visit (HOSPITAL_COMMUNITY)
Admission: EM | Admit: 2020-12-22 | Discharge: 2020-12-22 | Disposition: A | Discharge status: Home / Self Care | Payer: Medicare Other | Attending: Urgent Care | Admitting: Urgent Care

## 2020-12-22 ENCOUNTER — Other Ambulatory Visit: Payer: Self-pay

## 2020-12-22 DIAGNOSIS — N764 Abscess of vulva: Secondary | ICD-10-CM | POA: Diagnosis not present

## 2020-12-22 DIAGNOSIS — N949 Unspecified condition associated with female genital organs and menstrual cycle: Secondary | ICD-10-CM

## 2020-12-22 MED ORDER — DOXYCYCLINE HYCLATE 100 MG PO CAPS: 100.0000 mg | ORAL_CAPSULE | Freq: Two times a day (BID) | ORAL | 0 refills | Status: AC

## 2020-12-22 MED ORDER — TRAMADOL HCL 50 MG PO TABS: 50.0000 mg | ORAL_TABLET | Freq: Four times a day (QID) | ORAL | 0 refills | Status: AC | PRN

## 2020-12-22 NOTE — ED Triage Notes (Signed)
Pt in with c/o abscess to right groin x 2 days  Denies any drainage from area
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
## 2020-12-22 NOTE — Discharge Instructions (Addendum)
Please change her dressing 3-5 times daily as needed depending on how much it soaks.  Each time you change her dressing clean your wound with warm soapy water.  Do not apply any ointments or creams.  Take doxycycline for the infection.  Please schedule Tylenol at 500 mg - 650 mg once every 6 hours as needed for aches and pains.  If you still have pain despite taking Tylenol regularly, this is breakthrough pain.  You can use tramadol once every 6 hours for this.  Once your pain is better controlled, switch back to just Tylenol.

## 2020-12-23 ENCOUNTER — Telehealth (HOSPITAL_COMMUNITY): Payer: Self-pay | Admitting: Emergency Medicine

## 2020-12-23 NOTE — Telephone Encounter (Signed)
Pharmacist called this morning regarding recent prescription sent to the pharmacy (tramadol 50 mg). Pt recently picked up a prescription for Oxycodone 150 quantity. Pharmacist wants to know if it necessary for patient to take tramadol also.

## 2021-01-06 ENCOUNTER — Other Ambulatory Visit: Payer: Medicare Other

## 2021-01-12 ENCOUNTER — Other Ambulatory Visit: Payer: Medicare Other

## 2021-01-30 ENCOUNTER — Ambulatory Visit
Admission: RE | Admit: 2021-01-30 | Discharge: 2021-01-30 | Disposition: A | Discharge status: Home / Self Care | Payer: Medicare Other | Source: Ambulatory Visit | Attending: Nurse Practitioner | Admitting: Nurse Practitioner

## 2021-01-30 DIAGNOSIS — K760 Fatty (change of) liver, not elsewhere classified: Secondary | ICD-10-CM

## 2021-01-30 DIAGNOSIS — K7402 Hepatic fibrosis, advanced fibrosis: Secondary | ICD-10-CM

## 2021-02-15 ENCOUNTER — Ambulatory Visit: Payer: Medicare Other | Admitting: Family Medicine

## 2021-02-22 ENCOUNTER — Encounter (HOSPITAL_COMMUNITY): Payer: Self-pay

## 2021-02-22 ENCOUNTER — Ambulatory Visit (INDEPENDENT_AMBULATORY_CARE_PROVIDER_SITE_OTHER): Payer: Medicare Other

## 2021-02-22 ENCOUNTER — Ambulatory Visit (HOSPITAL_COMMUNITY)
Admission: EM | Admit: 2021-02-22 | Discharge: 2021-02-22 | Disposition: A | Discharge status: Home / Self Care | Payer: Medicare Other | Attending: Emergency Medicine | Admitting: Emergency Medicine

## 2021-02-22 ENCOUNTER — Other Ambulatory Visit: Payer: Self-pay

## 2021-02-22 DIAGNOSIS — R059 Cough, unspecified: Secondary | ICD-10-CM

## 2021-02-22 DIAGNOSIS — R062 Wheezing: Secondary | ICD-10-CM | POA: Diagnosis not present

## 2021-02-22 DIAGNOSIS — R0602 Shortness of breath: Secondary | ICD-10-CM

## 2021-02-22 MED ORDER — TRAMADOL 5 MG/ML ORAL SUSPENSION: 50.0000 mg | Freq: Once | ORAL | Status: DC

## 2021-02-22 MED ORDER — METHYLPREDNISOLONE SODIUM SUCC 125 MG IJ SOLR
125.0000 mg | Freq: Once | INTRAMUSCULAR | Status: AC
  Administered 2021-02-22: 125 mg via INTRAMUSCULAR

## 2021-02-22 MED ORDER — PREDNISONE 10 MG (21) PO TBPK: ORAL_TABLET | Freq: Every day | ORAL | 0 refills | Status: AC

## 2021-02-22 MED ORDER — METHYLPREDNISOLONE SODIUM SUCC 125 MG IJ SOLR
INTRAMUSCULAR | Status: AC
  Filled 2021-02-22: qty 2

## 2021-02-22 MED ORDER — BUDESONIDE-FORMOTEROL FUMARATE 80-4.5 MCG/ACT IN AERO: 2.0000 | INHALATION_SPRAY | Freq: Once | RESPIRATORY_TRACT | 12 refills | Status: DC

## 2021-02-22 NOTE — Discharge Instructions (Addendum)
You need to call and follow up with pulmonary lung doctor  Take the albuterol inhaler as needed if your are using excessive you need to go to the ER  Will give you a pill for pain since you have an allergy to nsaids  Cont to take your at home pain medications as needed
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
## 2021-02-22 NOTE — ED Triage Notes (Signed)
Pt c/o right arm pain that worsens when she coughs which is also a complaint today. States last weekend this pain was in her left arm, which has resolved but it returns in her right arm. Denies any injury to the area.

## 2021-02-27 ENCOUNTER — Telehealth (HOSPITAL_COMMUNITY): Payer: Self-pay | Admitting: Physician Assistant

## 2021-02-27 MED ORDER — BUDESONIDE-FORMOTEROL FUMARATE 80-4.5 MCG/ACT IN AERO: 2.0000 | INHALATION_SPRAY | Freq: Two times a day (BID) | RESPIRATORY_TRACT | 0 refills | Status: AC

## 2021-02-27 NOTE — Telephone Encounter (Signed)
Received phone message from pharmacy requesting clarification of Symbicort sent in by different provider on 02/22/2021.  Symbicort was originally written as 2 puffs once a day but instructions were changed to be 2 puffs twice a day.  A prescription was sent to the pharmacy as requested.  If patient has any concerns or pharmacist has any questions they can contact us for additional information.

## 2021-03-27 ENCOUNTER — Ambulatory Visit: Payer: Medicare Other | Admitting: Obstetrics and Gynecology

## 2021-06-08 DEATH — deceased

## 2021-07-03 IMAGING — US US ABDOMEN LIMITED
1 series · 14 of 25 positions shown · non-contrast
Comparison: 09/23/2018

CLINICAL DATA: Cirrhosis

EXAM:
ULTRASOUND ABDOMEN LIMITED RIGHT UPPER QUADRANT

[Series 1: us abdomen limited · 0.19mm/px · 14 of 49 slices shown]
[im 1/49]
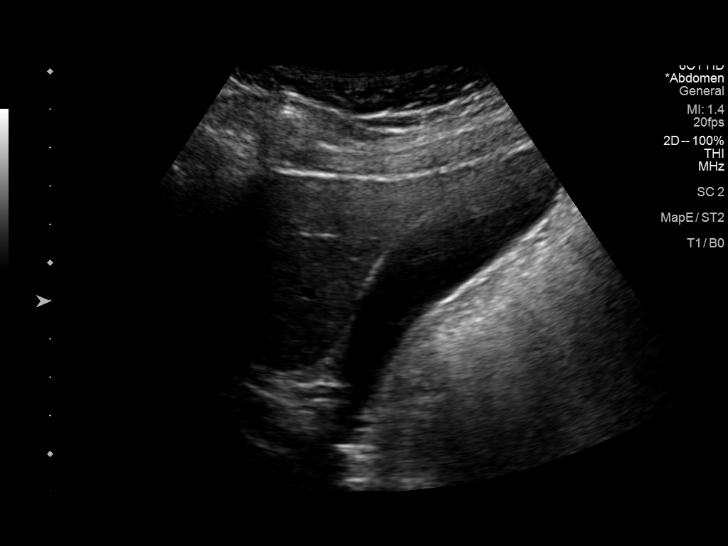
[im 5/49]
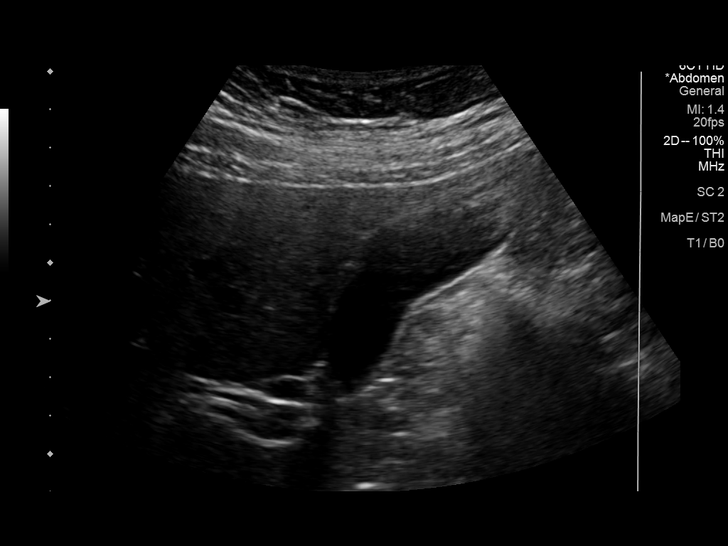
[im 9/49]
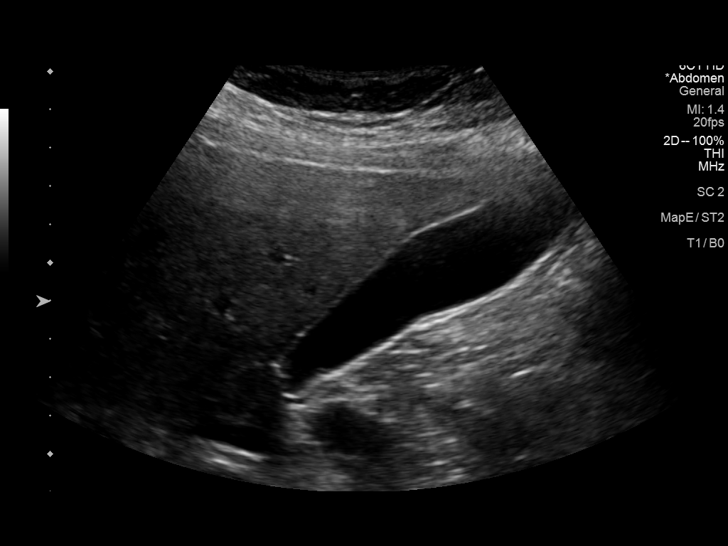
[im 13/49]
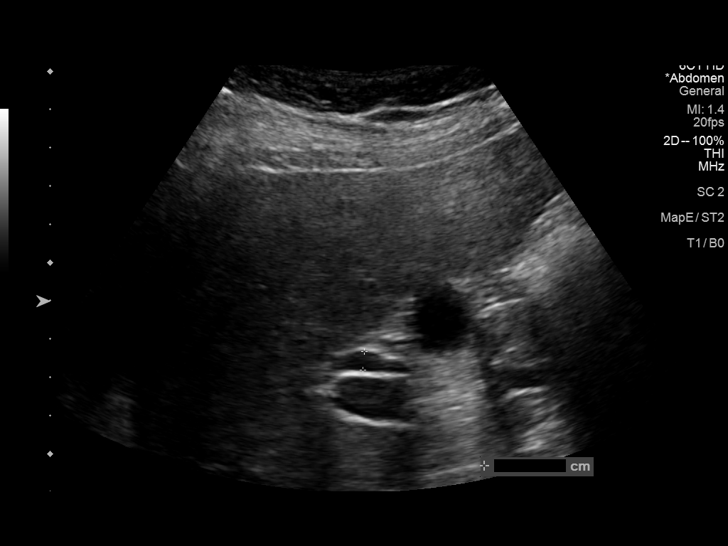
[im 17/49]
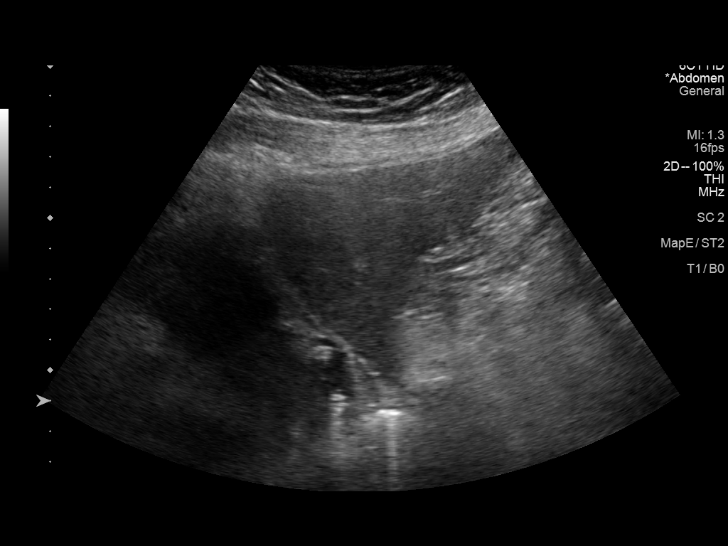
[im 19/49]
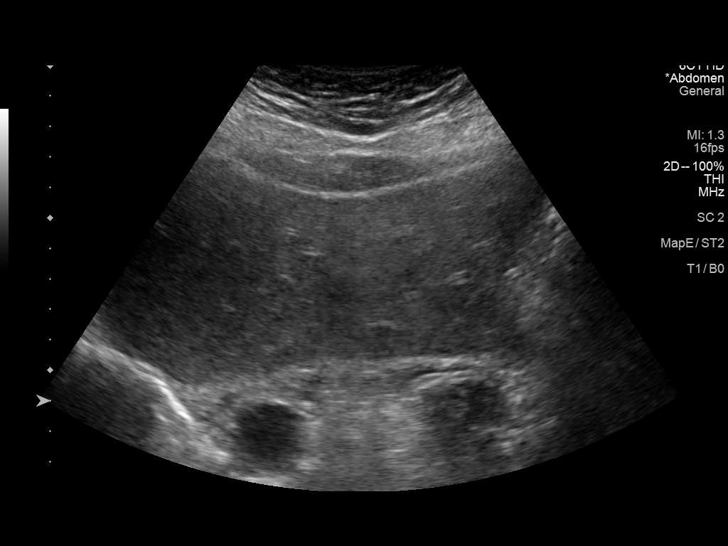
[im 23/49]
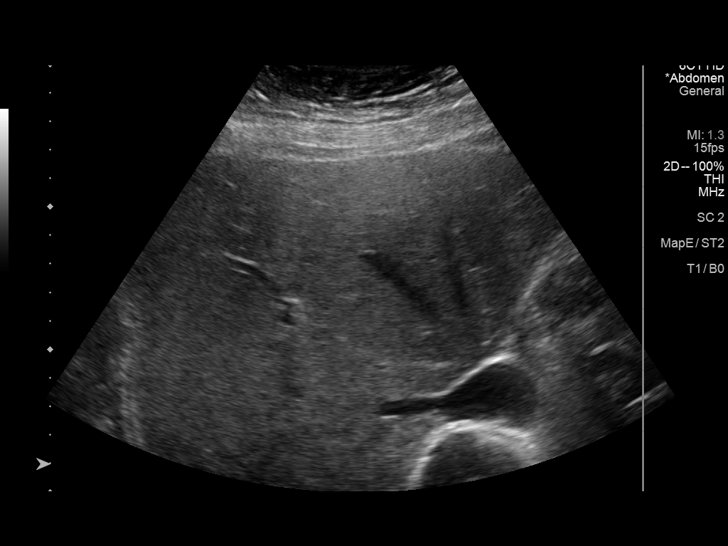
[im 27/49]
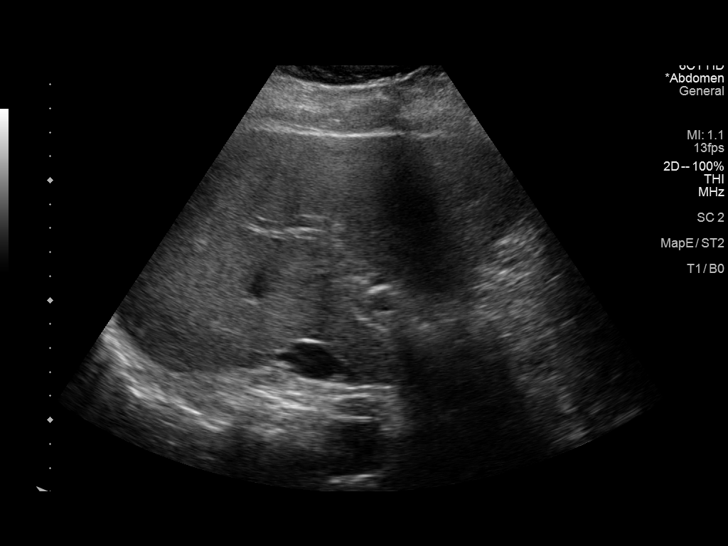
[im 31/49]
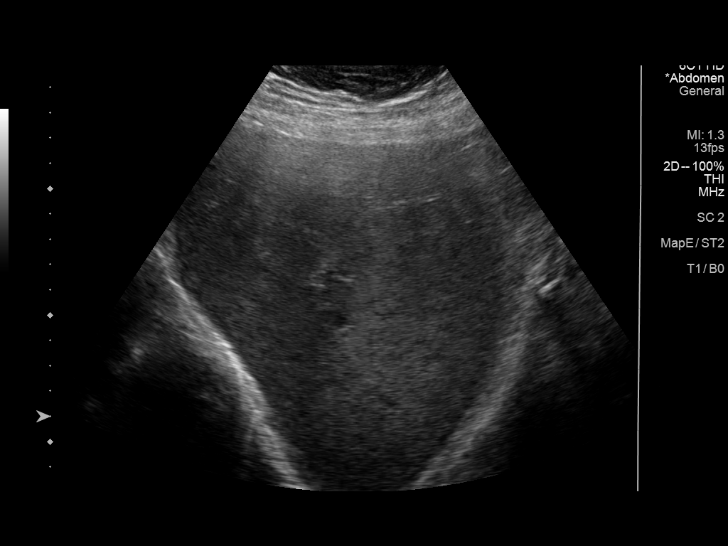
[im 33/49]
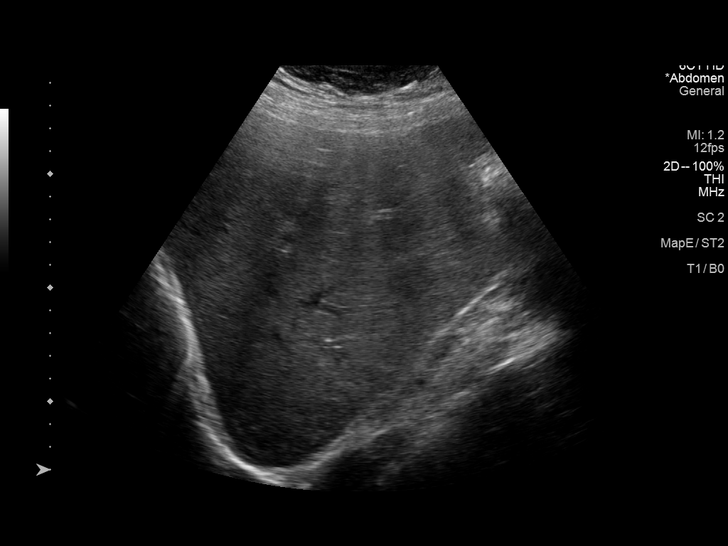
[im 37/49]
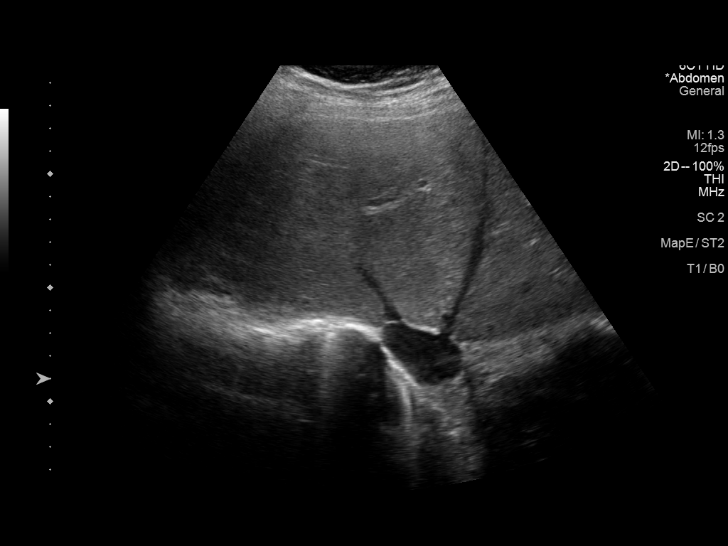
[im 41/49]
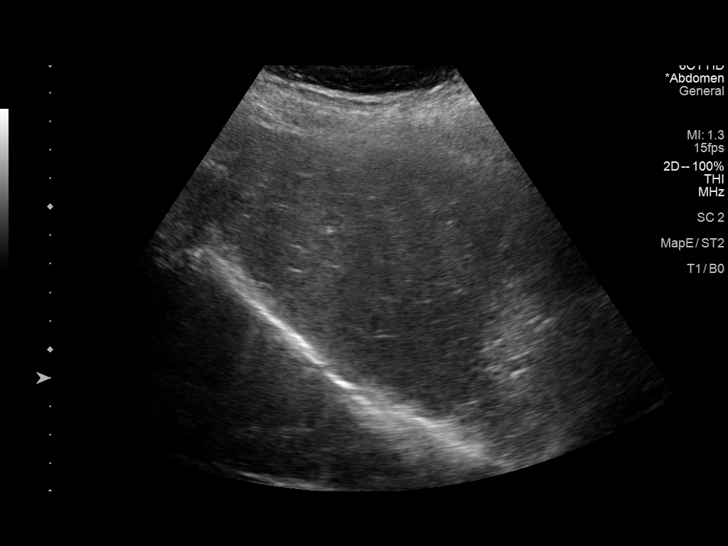
[im 45/49]
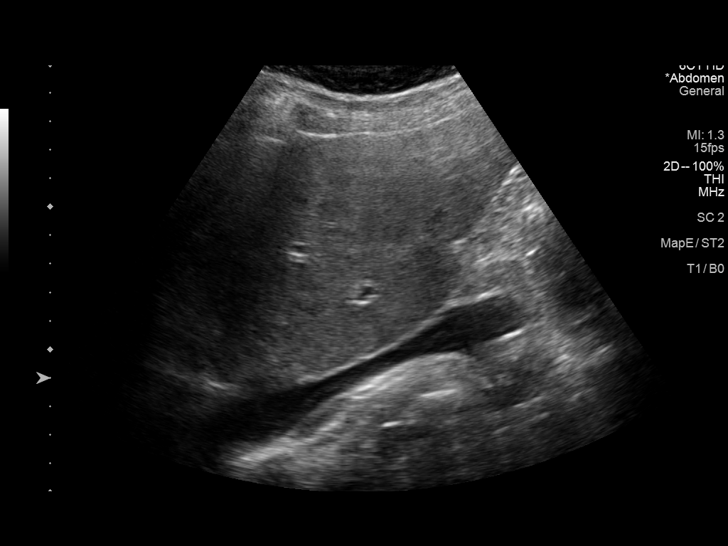
[im 49/49]
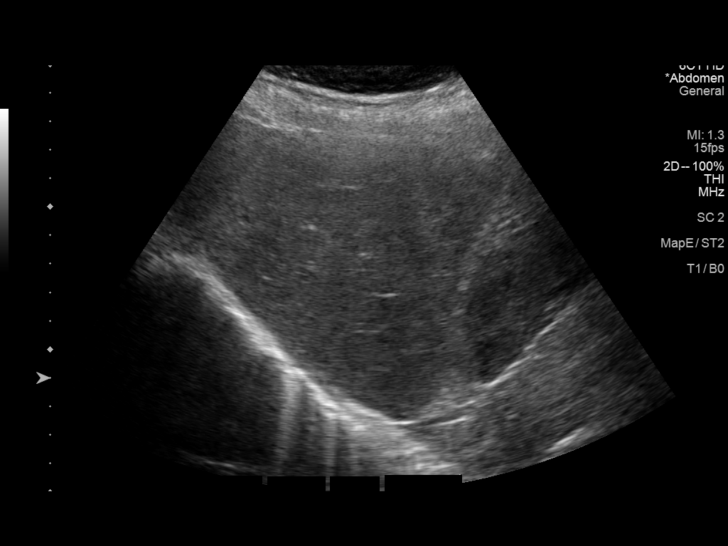

[14 of 25 positions shown; findings below may reference images not displayed]

FINDINGS: Gallbladder:

No gallstones or wall thickening visualized. No sonographic Murphy
sign noted by sonographer.

Common bile duct:

Diameter: 4.8 mm

Liver:

Minor heterogeneity and minimal increased echogenicity. Suspect mild
hepatic steatosis. No significant surface nodularity by ultrasound.
No focal abnormality or intrahepatic biliary dilatation. Portal vein
is patent on color Doppler imaging with normal direction of blood
flow towards the liver.

Other: No free fluid or ascites
IMPRESSION: Minor hepatic steatosis. Stable exam. No other significant finding
by ultrasound.

## 2022-10-23 ENCOUNTER — Encounter: Payer: Self-pay | Admitting: *Deleted

## 2023-02-08 ENCOUNTER — Other Ambulatory Visit: Payer: Self-pay
